# Patient Record
Sex: Male | Born: 1980 | Race: White | Hispanic: No | Smoking: Current every day smoker
Health system: Southern US, Community
[De-identification: ages and names within clinical notes are randomized; demographics above are authoritative.]

## PROBLEM LIST (undated history)

## (undated) ENCOUNTER — Emergency Department (HOSPITAL_COMMUNITY): Payer: Self-pay

## (undated) ENCOUNTER — Emergency Department (HOSPITAL_BASED_OUTPATIENT_CLINIC_OR_DEPARTMENT_OTHER): Admission: EM | Payer: Self-pay

## (undated) DIAGNOSIS — F1010 Alcohol abuse, uncomplicated: Secondary | ICD-10-CM

## (undated) DIAGNOSIS — F319 Bipolar disorder, unspecified: Secondary | ICD-10-CM

## (undated) DIAGNOSIS — F3181 Bipolar II disorder: Secondary | ICD-10-CM

## (undated) DIAGNOSIS — F4310 Post-traumatic stress disorder, unspecified: Secondary | ICD-10-CM

## (undated) HISTORY — PX: APPENDECTOMY: SHX54

## (undated) HISTORY — PX: KIDNEY STONE SURGERY: SHX686

---

## 2003-09-21 ENCOUNTER — Inpatient Hospital Stay (HOSPITAL_COMMUNITY): Admission: EM | Admit: 2003-09-21 | Discharge: 2003-09-24 | Payer: Self-pay | Admitting: Emergency Medicine

## 2011-02-01 ENCOUNTER — Emergency Department (HOSPITAL_COMMUNITY)
Admission: EM | Admit: 2011-02-01 | Discharge: 2011-02-02 | Disposition: A | Discharge status: Home / Self Care | Payer: Self-pay | Attending: Emergency Medicine | Admitting: Emergency Medicine

## 2011-02-01 DIAGNOSIS — F1910 Other psychoactive substance abuse, uncomplicated: Secondary | ICD-10-CM | POA: Insufficient documentation

## 2011-02-01 LAB — RAPID URINE DRUG SCREEN, HOSP PERFORMED
Barbiturates: NOT DETECTED
Opiates: NOT DETECTED

## 2011-02-01 LAB — CBC
HCT: 43.9 % (ref 39.0–52.0)
Hemoglobin: 15 g/dL (ref 13.0–17.0)
MCH: 31.2 pg (ref 26.0–34.0)
MCV: 91.3 fL (ref 78.0–100.0)
Platelets: 234 10*3/uL (ref 150–400)
RBC: 4.81 MIL/uL (ref 4.22–5.81)

## 2011-02-01 LAB — DIFFERENTIAL
Eosinophils Absolute: 0.3 10*3/uL (ref 0.0–0.7)
Lymphocytes Relative: 25 % (ref 12–46)
Lymphs Abs: 2 10*3/uL (ref 0.7–4.0)
Monocytes Relative: 9 % (ref 3–12)
Neutrophils Relative %: 62 % (ref 43–77)

## 2011-02-01 LAB — COMPREHENSIVE METABOLIC PANEL
Alkaline Phosphatase: 73 U/L (ref 39–117)
BUN: 13 mg/dL (ref 6–23)
CO2: 27 mEq/L (ref 19–32)
Chloride: 100 mEq/L (ref 96–112)
Creatinine, Ser: 0.97 mg/dL (ref 0.4–1.5)
GFR calc non Af Amer: >60 mL/min (ref >60)
Glucose, Bld: 96 mg/dL (ref 70–99)
Total Bilirubin: 0.3 mg/dL (ref 0.3–1.2)

## 2011-02-01 LAB — ETHANOL: Alcohol, Ethyl (B): <11 mg/dL — ABNORMAL HIGH (ref 0–10)

## 2014-05-21 DIAGNOSIS — Z8659 Personal history of other mental and behavioral disorders: Secondary | ICD-10-CM | POA: Insufficient documentation

## 2014-05-21 DIAGNOSIS — F17200 Nicotine dependence, unspecified, uncomplicated: Secondary | ICD-10-CM | POA: Insufficient documentation

## 2014-05-21 DIAGNOSIS — F1210 Cannabis abuse, uncomplicated: Secondary | ICD-10-CM | POA: Insufficient documentation

## 2014-05-21 DIAGNOSIS — Z008 Encounter for other general examination: Secondary | ICD-10-CM | POA: Insufficient documentation

## 2014-05-21 DIAGNOSIS — Z880 Allergy status to penicillin: Secondary | ICD-10-CM | POA: Insufficient documentation

## 2014-05-21 DIAGNOSIS — F1010 Alcohol abuse, uncomplicated: Secondary | ICD-10-CM | POA: Insufficient documentation

## 2014-05-22 ENCOUNTER — Emergency Department (HOSPITAL_COMMUNITY)
Admission: EM | Admit: 2014-05-22 | Discharge: 2014-05-22 | Disposition: A | Discharge status: Home / Self Care | Payer: Self-pay | Attending: Emergency Medicine | Admitting: Emergency Medicine

## 2014-05-22 ENCOUNTER — Encounter (HOSPITAL_COMMUNITY): Payer: Self-pay | Admitting: Emergency Medicine

## 2014-05-22 DIAGNOSIS — F1010 Alcohol abuse, uncomplicated: Secondary | ICD-10-CM

## 2014-05-22 DIAGNOSIS — F10920 Alcohol use, unspecified with intoxication, uncomplicated: Secondary | ICD-10-CM

## 2014-05-22 HISTORY — DX: Bipolar disorder, unspecified: F31.9

## 2014-05-22 HISTORY — DX: Bipolar II disorder: F31.81

## 2014-05-22 HISTORY — DX: Post-traumatic stress disorder, unspecified: F43.10

## 2014-05-22 LAB — COMPREHENSIVE METABOLIC PANEL
ALBUMIN: 4.2 g/dL (ref 3.5–5.2)
ALK PHOS: 84 U/L (ref 39–117)
ALT: 176 U/L — ABNORMAL HIGH (ref 0–53)
ANION GAP: 18 — AB (ref 5–15)
AST: 210 U/L — ABNORMAL HIGH (ref 0–37)
BUN: 11 mg/dL (ref 6–23)
CO2: 23 mEq/L (ref 19–32)
Calcium: 9 mg/dL (ref 8.4–10.5)
Chloride: 102 mEq/L (ref 96–112)
Creatinine, Ser: 1 mg/dL (ref 0.50–1.35)
GFR calc Af Amer: >90 mL/min (ref >90)
GFR calc non Af Amer: >90 mL/min (ref >90)
Glucose, Bld: 124 mg/dL — ABNORMAL HIGH (ref 70–99)
POTASSIUM: 4.1 meq/L (ref 3.7–5.3)
SODIUM: 143 meq/L (ref 137–147)
TOTAL PROTEIN: 7.5 g/dL (ref 6.0–8.3)
Total Bilirubin: 0.3 mg/dL (ref 0.3–1.2)

## 2014-05-22 LAB — RAPID URINE DRUG SCREEN, HOSP PERFORMED
Amphetamines: NOT DETECTED
Barbiturates: NOT DETECTED
Benzodiazepines: NOT DETECTED
COCAINE: NOT DETECTED
OPIATES: NOT DETECTED
TETRAHYDROCANNABINOL: POSITIVE — AB

## 2014-05-22 LAB — CBC
HEMATOCRIT: 41.2 % (ref 39.0–52.0)
Hemoglobin: 14.2 g/dL (ref 13.0–17.0)
MCH: 32.1 pg (ref 26.0–34.0)
MCHC: 34.5 g/dL (ref 30.0–36.0)
MCV: 93.2 fL (ref 78.0–100.0)
PLATELETS: 208 10*3/uL (ref 150–400)
RBC: 4.42 MIL/uL (ref 4.22–5.81)
RDW: 12.7 % (ref 11.5–15.5)
WBC: 7 10*3/uL (ref 4.0–10.5)

## 2014-05-22 LAB — ETHANOL: Alcohol, Ethyl (B): 292 mg/dL — ABNORMAL HIGH (ref 0–11)

## 2014-05-22 LAB — ACETAMINOPHEN LEVEL

## 2014-05-22 LAB — SALICYLATE LEVEL: Salicylate Lvl: <2 mg/dL — ABNORMAL LOW (ref 2.8–20.0)

## 2014-05-22 MED ORDER — ZOLPIDEM TARTRATE 5 MG PO TABS: 5.0000 mg | ORAL_TABLET | Freq: Every evening | ORAL | Status: DC | PRN

## 2014-05-22 MED ORDER — LORAZEPAM 1 MG PO TABS
1.0000 mg | ORAL_TABLET | Freq: Three times a day (TID) | ORAL | Status: DC | PRN
  Administered 2014-05-22 (×2): 1 mg via ORAL

## 2014-05-22 MED ORDER — THIAMINE HCL 100 MG/ML IJ SOLN: 100.0000 mg | Freq: Every day | INTRAMUSCULAR | Status: DC

## 2014-05-22 MED ORDER — IBUPROFEN 400 MG PO TABS: 600.0000 mg | ORAL_TABLET | Freq: Three times a day (TID) | ORAL | Status: DC | PRN

## 2014-05-22 MED ORDER — ALUM & MAG HYDROXIDE-SIMETH 200-200-20 MG/5ML PO SUSP: 30.0000 mL | ORAL | Status: DC | PRN

## 2014-05-22 MED ORDER — ONDANSETRON HCL 4 MG PO TABS: 4.0000 mg | ORAL_TABLET | Freq: Three times a day (TID) | ORAL | Status: DC | PRN

## 2014-05-22 MED ORDER — LORAZEPAM 1 MG PO TABS: 0.0000 mg | ORAL_TABLET | Freq: Two times a day (BID) | ORAL | Status: DC

## 2014-05-22 MED ORDER — VITAMIN B-1 100 MG PO TABS
100.0000 mg | ORAL_TABLET | Freq: Every day | ORAL | Status: DC
  Administered 2014-05-22: 100 mg via ORAL
  Filled 2014-05-22: qty 1

## 2014-05-22 MED ORDER — LORAZEPAM 1 MG PO TABS
0.0000 mg | ORAL_TABLET | Freq: Four times a day (QID) | ORAL | Status: DC
  Filled 2014-05-22 (×2): qty 1

## 2014-05-22 MED ORDER — NICOTINE 21 MG/24HR TD PT24
21.0000 mg | MEDICATED_PATCH | Freq: Once | TRANSDERMAL | Status: DC
  Administered 2014-05-22: 21 mg via TRANSDERMAL
  Filled 2014-05-22: qty 1

## 2014-05-22 MED ORDER — ACETAMINOPHEN 325 MG PO TABS: 650.0000 mg | ORAL_TABLET | ORAL | Status: DC | PRN

## 2014-05-22 NOTE — Discharge Instructions (Signed)
Do not drink alcohol - follow up with AA, and use resource guide provided for additional community resources. If mental health issues, follow up with [facility] - see referral. Also follow up with primary care doctor in coming week. Return to ER if worse, new symptoms, fevers, medical emergency, other concern.     Alcohol Intoxication Alcohol intoxication occurs when the amount of alcohol that a person has consumed impairs his or her ability to mentally and physically function. Alcohol directly impairs the normal chemical activity of the brain. Drinking large amounts of alcohol can lead to changes in mental function and behavior, and it can cause many physical effects that can be harmful.  Alcohol intoxication can range in severity from mild to very severe. Various factors can affect the level of intoxication that occurs, such as the person's age, gender, weight, frequency of alcohol consumption, and the presence of other medical conditions (such as diabetes, seizures, or heart conditions). Dangerous levels of alcohol intoxication may occur when people drink large amounts of alcohol in a short period (binge drinking). Alcohol can also be especially dangerous when combined with certain prescription medicines or "recreational" drugs. SIGNS AND SYMPTOMS Some common signs and symptoms of mild alcohol intoxication include:  Loss of coordination.  Changes in mood and behavior.  Impaired judgment.  Slurred speech. As alcohol intoxication progresses to more severe levels, other signs and symptoms will appear. These may include:  Vomiting.  Confusion and impaired memory.  Slowed breathing.  Seizures.  Loss of consciousness. DIAGNOSIS  Your health care provider will take a medical history and perform a physical exam. You will be asked about the amount and type of alcohol you have consumed. Blood tests will be done to measure the concentration of alcohol in your blood. In many places, your blood  alcohol level must be lower than 80 mg/dL (1.19%) to legally drive. However, many dangerous effects of alcohol can occur at much lower levels.  TREATMENT  People with alcohol intoxication often do not require treatment. Most of the effects of alcohol intoxication are temporary, and they go away as the alcohol naturally leaves the body. Your health care provider will monitor your condition until you are stable enough to go home. Fluids are sometimes given through an IV access tube to help prevent dehydration.  HOME CARE INSTRUCTIONS  Do not drive after drinking alcohol.  Stay hydrated. Drink enough water and fluids to keep your urine clear or pale yellow. Avoid caffeine.   Only take over-the-counter or prescription medicines as directed by your health care provider.  SEEK MEDICAL CARE IF:   You have persistent vomiting.   You do not feel better after a few days.  You have frequent alcohol intoxication. Your health care provider can help determine if you should see a substance use treatment counselor. SEEK IMMEDIATE MEDICAL CARE IF:   You become shaky or tremble when you try to stop drinking.   You shake uncontrollably (seizure).   You throw up (vomit) blood. This may be bright red or may look like black coffee grounds.   You have blood in your stool. This may be bright red or may appear as a black, tarry, bad smelling stool.   You become lightheaded or faint.  MAKE SURE YOU:   Understand these instructions.  Will watch your condition.  Will get help right away if you are not doing well or get worse. Document Released: 06/06/2005 Document Revised: 04/29/2013 Document Reviewed: 01/30/2013 [facility] Patient Information 2015 [place], [place]. This  information is not intended to replace advice given to you by your health care provider. Make sure you discuss any questions you have with your health care provider.      Alcohol Use Disorder Alcohol use disorder is a mental  disorder. It is not a one-time incident of heavy drinking. Alcohol use disorder is the excessive and uncontrollable use of alcohol over time that leads to problems with functioning in one or more areas of daily living. People with this disorder risk harming themselves and others when they drink to excess. Alcohol use disorder also can cause other mental disorders, such as mood and anxiety disorders, and serious physical problems. People with alcohol use disorder often misuse other drugs.  Alcohol use disorder is common and widespread. Some people with this disorder drink alcohol to cope with or escape from negative life events. Others drink to relieve chronic pain or symptoms of mental illness. People with a family history of alcohol use disorder are at higher risk of losing control and using alcohol to excess.  SYMPTOMS  Signs and symptoms of alcohol use disorder may include the following:   Consumption ofalcohol inlarger amounts or over a longer period of time than intended.  Multiple unsuccessful attempts to cutdown or control alcohol use.   A great deal of time spent obtaining alcohol, using alcohol, or recovering from the effects of alcohol (hangover).  A strong desire or urge to use alcohol (cravings).   Continued use of alcohol despite problems at work, school, or home because of alcohol use.   Continued use of alcohol despite problems in relationships because of alcohol use.  Continued use of alcohol in situations when it is physically hazardous, such as driving a car.  Continued use of alcohol despite awareness of a physical or psychological problem that is likely related to alcohol use. Physical problems related to alcohol use can involve the brain, heart, liver, stomach, and intestines. Psychological problems related to alcohol use include intoxication, depression, anxiety, psychosis, delirium, and dementia.   The need for increased amounts of alcohol to achieve the same  desired effect, or a decreased effect from the consumption of the same amount of alcohol (tolerance).  Withdrawal symptoms upon reducing or stopping alcohol use, or alcohol use to reduce or avoid withdrawal symptoms. Withdrawal symptoms include:  Racing heart.  Hand tremor.  Difficulty sleeping.  Nausea.  Vomiting.  Hallucinations.  Restlessness.  Seizures. DIAGNOSIS Alcohol use disorder is diagnosed through an assessment by your health care provider. Your health care provider may start by asking three or four questions to screen for excessive or problematic alcohol use. To confirm a diagnosis of alcohol use disorder, at least two symptoms must be present within a 34-month period. The severity of alcohol use disorder depends on the number of symptoms:  Mild--two or three.  Moderate--four or five.  Severe--six or more. Your health care provider may perform a physical exam or use results from lab tests to see if you have physical problems resulting from alcohol use. Your health care provider may refer you to a mental health professional for evaluation. TREATMENT  Some people with alcohol use disorder are able to reduce their alcohol use to low-risk levels. Some people with alcohol use disorder need to quit drinking alcohol. When necessary, mental health professionals with specialized training in substance use treatment can help. Your health care provider can help you decide how severe your alcohol use disorder is and what type of treatment you need. The following forms of  treatment are available:   Detoxification. Detoxification involves the use of prescription medicines to prevent alcohol withdrawal symptoms in the first week after quitting. This is important for people with a history of symptoms of withdrawal and for heavy drinkers who are likely to have withdrawal symptoms. Alcohol withdrawal can be dangerous and, in severe cases, cause death. Detoxification is usually provided in a  hospital or in-patient substance use treatment facility.  Counseling or talk therapy. Talk therapy is provided by substance use treatment counselors. It addresses the reasons people use alcohol and ways to keep them from drinking again. The goals of talk therapy are to help people with alcohol use disorder find healthy activities and ways to cope with life stress, to identify and avoid triggers for alcohol use, and to handle cravings, which can cause relapse.  Medicines.Different medicines can help treat alcohol use disorder through the following actions:  Decrease alcohol cravings.  Decrease the positive reward response felt from alcohol use.  Produce an uncomfortable physical reaction when alcohol is used (aversion therapy).  Support groups. Support groups are run by people who have quit drinking. They provide emotional support, advice, and guidance. These forms of treatment are often combined. Some people with alcohol use disorder benefit from intensive combination treatment provided by specialized substance use treatment centers. Both inpatient and outpatient treatment programs are available. Document Released: 10/04/2004 Document Revised: 01/11/2014 Document Reviewed: 12/04/2012 [facility] Patient Information 2015 [place], [place]. This information is not intended to replace advice given to you by your health care provider. Make sure you discuss any questions you have with your health care provider.      Emergency Department Resource Guide 1) Find a Doctor and Pay Out of Pocket Although you won't have to find out who is covered by your insurance plan, it is a good idea to ask around and get recommendations. You will then need to call the office and see if the doctor you have chosen will accept you as a new patient and what types of options they offer for patients who are self-pay. Some doctors offer discounts or will set up payment plans for their patients who do not have insurance, but you  will need to ask so you aren't surprised when you get to your appointment.  2) Contact Your Local Health Department Not all health departments have doctors that can see patients for sick visits, but many do, so it is worth a call to see if yours does. If you don't know where your local health department is, you can check in your phone book. The CDC also has a tool to help you locate your state's health department, and many state websites also have listings of all of their local health departments.  3) Find a Walk-in Clinic If your illness is not likely to be very severe or complicated, you may want to try a walk in clinic. These are popping up all over the country in pharmacies, drugstores, and shopping centers. They're usually staffed by nurse practitioners or physician assistants that have been trained to treat common illnesses and complaints. They're usually fairly quick and inexpensive. However, if you have serious medical issues or chronic medical problems, these are probably not your best option.  No Primary Care Doctor: - Call Health Connect at  [phone] - they can help you locate a primary care doctor that  accepts your insurance, provides certain services, etc. - Physician Referral Service- [phone]  Chronic Pain Problems: Organization  Address  Phone   Notes  [facility]  [phone] Patients need to be referred by their primary care doctor.   Medication Assistance: Organization         Address  Phone   Notes  [facility] Medication [facility] [address] [place], [place] [address] [phone] --Must be a resident of [facility] -- Must have NO insurance coverage whatsoever (no Medicaid/ Medicare, etc.) -- The pt. MUST have a primary care doctor that directs their care regularly and follows them in the community   MedAssist  [phone]   [employer]  [phone]    Agencies that provide inexpensive medical  care: Organization         Address  Phone   Notes  [place]  [phone]   [facility]    [phone]   [facility] [place], [place] [address] [phone]   [place] [address] [place] [phone]   Planned Parenthood    [phone]   [facility]    [phone]   [place] and [place], [place] Phone:  [phone], Fax:  [phone] Hours of Operation:  9 am - 6 pm, M-F.  Also accepts Medicaid/Medicare and self-pay.  [facility] for [place], [address], [place] Phone: [phone], Fax: [phone]. Hours of Operation:  8:30 am - 5:30 pm, M-F.  Also accepts Medicaid and self-pay.  [facility] [place] [address], [place] Phone: [phone]   [place], [place], [place] [phone], Ext. [phone] Mondays & Thursdays: 7-9 AM.  First 15 patients are seen on a first come, first serve basis.    [place] Providers:  Organization         Address  Phone   Notes  [facility] [address], [place] [phone] Also accepts self-pay patients.  [facility] [address], [place]  [phone]   [place], [address], [place] [phone]   [facility] [address], [place] [phone]   Lucianne Lei [address], [place]   [phone] Only accepts [place] Access [place] patients after they have their name applied to their card.   Self-Pay (no insurance) in [facility]:  Organization         Address  Phone   Notes  Sickle Cell Patients, [facility] Internal Medicine [place] [phone]   [facility] [place] [phone]   [facility] [place]  [place], [place], [place] [phone]   [facility]/Dr. Osei-Bonsu  [address] [place] or [address], [place] [phone] Phone number for both [place] and [place] locations is the same.  Urgent Medical and [facility] [address] [place] [phone]   [facility] [address], [place] or [address] [phone]   [facility] [address], [place] [phone], phone; [phone], fax Sees patients 1st and 3rd Saturday of every month.  Must not qualify  for public or private insurance (i.e. Medicaid, Medicare, [place] Health Choice, Veterans' Benefits)  Household income should be no more than 200% of the poverty level The clinic cannot treat you if you are pregnant or think you are pregnant  Sexually transmitted diseases are not treated at the clinic.    Dental Care: Organization         Address  Phone  Notes  [facility] Department of [facility] [place] [phone] Accepts children up to age 41 who are enrolled in [place] or [place]; pregnant women with a Medicaid card; and children who have applied for Medicaid or [place] Health Choice, but were declined, whose parents can pay a reduced fee at time of service.  [facility] Department of [facility]  [address], [place] [phone] Accepts children up to age 15 who are enrolled in [place] or [place]; pregnant women with a Medicaid card; and children who have applied for Medicaid or [place] Health Choice, but were declined, whose parents can pay a reduced fee at time of service.  [place] Adult [facility] Access PROGRAM  [place] [phone] Patients are seen by appointment only. Walk-ins are not accepted. [place] will see patients 6 years of age and older. Monday - Tuesday (8am-5pm) Most Wednesdays (8:30-5pm) $30 per visit, cash only  [facility] Access [facility]  [address], [facility] [phone] Patients are seen by appointment only. Walk-ins are not accepted. [place] will see patients 25 years of age and older. One Wednesday Evening (Monthly: Volunteer Based).  $30 per visit, cash only  [place]  [phone] for adults; Children under age 27, call Graduate Pediatric Dentistry at [phone]. Children aged 54-14, please call [phone] to request a pediatric application.  Dental services are provided in all areas of dental care including fillings, crowns and bridges, complete and partial dentures, implants, gum treatment, root canals, and extractions. Preventive care is also provided. Treatment is provided to both adults and children. Patients are selected via a lottery and there is often a waiting list.   [facility] [address] [place]  [phone] [email or web address]   [facility] [address] [place], [place] [phone], Ext. [phone] Second and Fourth Thursday of each month, opens at 6:30 AM; Clinic ends at 9 AM.  Patients are seen on a first-come first-served basis, and a limited number are seen during each clinic.   [facility]  [address] Hillard Danker [place], [place] [phone]   Eligibility Requirements You must have lived in [place], [place], or [place] counties for at least the last three months.   You cannot be eligible for state or federal sponsored [employer], including [employer], [place], or [employer].   You generally cannot be eligible for healthcare insurance through your employer.    How to apply: Eligibility screenings are held every Tuesday and Wednesday afternoon from 1:00 pm until 4:00 pm. You do not need an appointment for the interview!   [facility] [address], [place], [place]   [place]  [place] Department  [place]  [phone]    Behavioral Health Resources in the Community: Intensive Outpatient Programs Organization         Address  Phone  Notes  [facility] [address] [place], [place] [phone]   [facility] Outpatient [address], [place], [place]   ADS: [facility] [address] [place], [place]   [place] [address]  [place], [place] or [phone]   Substance Abuse Resources Organization         Address  Phone  Notes  Alcohol [facility] Drug Services  [phone]   [place]  [phone]   [facility] [place]   Chinita Pester  [phone]   Residential & Outpatient Substance Abuse Program  [phone]   Psychological Services Organization         Address  Phone  Notes  [facility] [place]  [place]  [phone]   [place] [address], [place] or [phone]    Mobile Crisis Teams Organization         Address  Phone  Notes  [facility], Mobile Crisis Care Unit  [phone]   [facility]  [address] [place], [place]   [place] [address], [place] [phone]    Self-Help/Support Groups Organization         Address  Phone             Notes  [place]. of Fate - variety of support groups  [place] Call for more information  Narcotics Anonymous (NA), [facility] [address], [employer] [place]  2 meetings at this location   Special educational needs teacher         Address  Phone  Notes  ASAP Residential Treatment [place],    [place]  [phone]   [facility]  [address] [place] [address], [place], [place]   [place], [place] [phone] Admissions: 8am-3pm M-F  Incentives Substance [place] [address]    [place], [place] [phone]   [facility] [address] [place], [place], [place]   [facility] [address],  [place], [place]   [facility] - Intensive Outpatient [address] Kristeen Mans [address], [place], [place]   [facility] ([place].,  [place], [place] [phone] or [phone]   [facility] ([facility]S) [address] [place], [place] Accepts Medicaid  [facility] [place] [address]  [place] [phone] Substance Abuse/Addiction Treatment   [facility] Organization         Address  Phone  Notes  [facility]  [phone]   Domenic Schwab, PhD [address] Arlis Porta [place], [place]   [phone] or [phone]   [place]   [address] [place], [place] [phone]   [facility] [address] [place], [place] [phone] Insurance/Medicaid/sponsorship through [facility] and Families [address] [place]                                    [place], [place] [phone] [place] [address], [place] [phone]    Dr. Adele Schilder  [phone]   [facility] [place]  [facility]. 1) [address], [place] 2) [place] 3)  [place] [address], [place] [phone]  [phone]   [place] [phone] or [phone] (After Hours)
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
## 2014-05-22 NOTE — ED Provider Notes (Signed)
Medical screening examination/treatment/procedure(s) were performed by non-physician practitioner and as supervising physician I was immediately available for consultation/collaboration.   EKG Interpretation None        Tomasita Crumble, MD 05/22/14 1721

## 2014-05-22 NOTE — ED Notes (Signed)
Patient arrives with complaint of poly-substance abuse with desire for detox. States that he has been abusing ETOH for many years. Explains that he was clean for a while and then many family member died and he re-lapsed. States reason for presenting tonight was do to seeing a friend die from cirrhosis, he went to church, found a new family, and has decided "to put it in the lords hands". Explains that his drug screen will show numerous substances because he wanted to make sure it was positive. Last ETOH reported as "right after registering, I finished my last beer".

## 2014-05-22 NOTE — ED Notes (Signed)
Pt comes voluntary asking for help for detox from ETOH, pt states that he drinks a lot of Vodka and beer daily and use Mariguana every day, pt states he is trying to get out of this but he needs help.

## 2014-05-22 NOTE — ED Notes (Signed)
TTS in process 

## 2014-05-22 NOTE — ED Provider Notes (Signed)
Pt with hx etoh abuse.  States has been through many inpatient programs in past and chosen not to maintain sobriety.   [facility] staff has been unable to place into inpatient program.  Given above, and in setting that pt is calm/alert, no tremor or shakes, no diaphoresis, no hx dts, hr 90, rr 16 - pt appears stable for referral to outpatient rehab/detox.  Discussed plan w pt.  Pt is eating and drinking. Normal appetite. Normal mood/affect. No SI.    Suzi Roots, MD 05/22/14 1054

## 2014-05-22 NOTE — BH Assessment (Signed)
Binnie Rail, [facility] at [facility], confirms adult unit is currently at capacity. Contacted the following facilities for placement:   BED AVAILABLE, FAXED CLINICAL INFORMATION:  [facility], per Misty Stanley   AT CAPACITY:  [facility], per [facility], per Alfredia Client, per [facility], per [facility], per [facility], per [facility], per [facility], per [facility], per [facility], per [facility], per [facility], per [facility], per [facility], per [facility], per Thayer Ohm.   MULTIPLE ATTEMPTS TO CONTACT WITH NO RESPONSE:  [facility]  [facility]  [facility]   PT DECLINED:  [facility], per Erskine Squibb no detox services  [facility], per [place] no detox services  [employer], per [facility] [employer] no detox services    Harlin Rain Patsy [place], [facility], [facility]  Triage Specialist  [phone]

## 2014-05-22 NOTE — ED Notes (Signed)
telephysch set up at the bedside.

## 2014-05-22 NOTE — Progress Notes (Addendum)
Role of Case Manager explained to patient.Patient verbalizes understanding.Patient requesting inpatient Detox admission.[facility] staff are not able to place Patient in an inpatient program.Patient is calm and appropriate and CM explained she can assist with some resources for patient.Patient receptive to this Patient provided with resources for [employer] for services( contact numbers provided) Patient also provided with a 211 Resource card,and support group information for the Mental health association in [place].Patient does not have a PCP or health Insurance.This CM  Provided patient with resource contact sheet for the [facility] health / Wellness center( patient also gave this CM his verbal consent for this CM to e-mail the [facility] and assist with his PCP appointment.Patient also provided with shelter list.CM contacted the [facility] who had no available beds.Patient  [facility].This CM called and was advised bed availability not released till later tonight( patient updated of this and reports he will Call.Patient reports he has no family or friends in the area but is in contact with his church pastor who is providing support.Food pantry resouces given to Patient.Patient thanked CM for resources and education.

## 2014-05-22 NOTE — BH Assessment (Signed)
Assessment completed. Consulted Dr. Adela Glimpse who recommended inpatient treatment. [facility] at capacity. TTS will contact other facilities for placement. Sharilyn Sites, PA-C has been notified of the recommendation.

## 2014-05-22 NOTE — ED Notes (Signed)
Pt wondering in hallway of Pod A requesting to smoke a cigarette. Pt states "I can here to detox off alcohol, not to quit smoking." Pt agitated about not being able to smoke. Informed patient MD can order a nicotine patch

## 2014-05-22 NOTE — ED Provider Notes (Signed)
CSN: [id number]     Arrival date & time 05/21/14  2333 History   First MD Initiated Contact with Patient 05/22/14 0053     Chief Complaint  Patient presents with  . Alcohol Problem  . Medical Clearance     (Consider location/radiation/quality/duration/timing/severity/associated sxs/prior Treatment) The history is provided by the patient and medical records.   This is a 33 y.o. M with PMH significant for PTSD, bipolar disorder, manic depression, presenting to the ED requesting alcohol detox. Patient states he was previously sober for 3 years, but relapsed after the death of his father. He states for the past 3 years he has been in and out of AA seeking treatment for his alcohol abuse. He states he drinks on a daily basis, at least one fifth of liquor and a 12 pack of beer. He does endorse occasional illicit drug use, mostly marijuana. Last use was yesterday.  Patient denies any suicidal or homicidal ideation. He denies any auditory or visual hallucinations.  Past Medical History  Diagnosis Date  . PTSD (post-traumatic stress disorder)   . Bipolar 2 disorder   . Manic depression    Past Surgical History  Procedure Laterality Date  . Appendectomy     No family history on file. History  Substance Use Topics  . Smoking status: Current Every Day Smoker -- 1.50 packs/day for 7 years    Types: Cigarettes  . Smokeless tobacco: Not on file  . Alcohol Use: Yes    Review of Systems  Psychiatric/Behavioral:       Detox  All other systems reviewed and are negative.     Allergies  Penicillins  Home Medications   Prior to Admission medications   Not on File   BP 131/78  Pulse 107  Temp(Src) 98.1 F (36.7 C) (Oral)  Resp 18  Ht  (1.753 m)  Wt 165 lb (74.844 kg)  BMI 24.36 kg/m2  SpO2 95%  Physical Exam  Nursing note and vitals reviewed. Constitutional: He is oriented to person, place, and time. He appears well-developed and well-nourished.  HENT:  Head:  Normocephalic and atraumatic.  Mouth/Throat: Oropharynx is clear and moist.  Eyes: Conjunctivae and EOM are normal. Pupils are equal, round, and reactive to light.  Neck: Normal range of motion.  Cardiovascular: Normal rate, regular rhythm and normal heart sounds.   Pulmonary/Chest: Effort normal and breath sounds normal.  Abdominal: Soft. Bowel sounds are normal.  Musculoskeletal: Normal range of motion.  Neurological: He is alert and oriented to person, place, and time.  Skin: Skin is warm and dry.  Psychiatric: He has a normal mood and affect. Thought content normal. He is not actively hallucinating. He expresses no homicidal and no suicidal ideation. He expresses no suicidal plans and no homicidal plans.    ED Course  Procedures (including critical care time) Labs Review Labs Reviewed  COMPREHENSIVE METABOLIC PANEL - Abnormal; Notable for the following:    Glucose, Bld 124 (*)    AST 210 (*)    ALT 176 (*)    Anion gap 18 (*)    All other components within normal limits  ETHANOL - Abnormal; Notable for the following:    Alcohol, Ethyl (B) 292 (*)    All other components within normal limits  SALICYLATE LEVEL - Abnormal; Notable for the following:    Salicylate Lvl <2.0 (*)    All other components within normal limits  ACETAMINOPHEN LEVEL  CBC  URINE RAPID DRUG SCREEN (HOSP PERFORMED)  Imaging Review No results found.   EKG Interpretation None      MDM   Final diagnoses:  Alcohol abuse   33 year old male in the ED voluntarily requesting detox from alcohol. He has been having issues with alcohol abuse since the death of father 3 years ago.  On exam, patient does appear intoxicated and somewhat anxious but is easily calmed. Lab work was obtained-- transaminitis with 2:1 AST/ALT ratio consistent with alcohol abuse.  Ethanol 292.  Patient medically cleared and placed on CIWA protocol, temp holding orders in place.  TTS evaluation pending.  Patient initially upset that  he could not go outside to smoke, informed of rules requiring him to remain in ED for assessment.  Patient given nicotine patch and agreed to stay.  2:56 AM TTS has evaluated patient, recommends IP treatment.  Patient currently awaiting placement.  VS remain stable.  Garlon Hatchet, PA-C 05/22/14 [phone]

## 2014-07-15 ENCOUNTER — Encounter (HOSPITAL_BASED_OUTPATIENT_CLINIC_OR_DEPARTMENT_OTHER): Payer: Self-pay | Admitting: Emergency Medicine

## 2014-07-15 ENCOUNTER — Emergency Department (HOSPITAL_BASED_OUTPATIENT_CLINIC_OR_DEPARTMENT_OTHER)
Admission: EM | Admit: 2014-07-15 | Discharge: 2014-07-15 | Disposition: A | Discharge status: Home / Self Care | Payer: Self-pay | Attending: Emergency Medicine | Admitting: Emergency Medicine

## 2014-07-15 DIAGNOSIS — F4310 Post-traumatic stress disorder, unspecified: Secondary | ICD-10-CM | POA: Insufficient documentation

## 2014-07-15 DIAGNOSIS — F3181 Bipolar II disorder: Secondary | ICD-10-CM | POA: Insufficient documentation

## 2014-07-15 DIAGNOSIS — K088 Other specified disorders of teeth and supporting structures: Secondary | ICD-10-CM | POA: Insufficient documentation

## 2014-07-15 DIAGNOSIS — Z880 Allergy status to penicillin: Secondary | ICD-10-CM | POA: Insufficient documentation

## 2014-07-15 DIAGNOSIS — Z720 Tobacco use: Secondary | ICD-10-CM | POA: Insufficient documentation

## 2014-07-15 DIAGNOSIS — K0889 Other specified disorders of teeth and supporting structures: Secondary | ICD-10-CM

## 2014-07-15 DIAGNOSIS — Z79899 Other long term (current) drug therapy: Secondary | ICD-10-CM | POA: Insufficient documentation

## 2014-07-15 DIAGNOSIS — F319 Bipolar disorder, unspecified: Secondary | ICD-10-CM | POA: Insufficient documentation

## 2014-07-15 MED ORDER — CLINDAMYCIN HCL 300 MG PO CAPS: 300.0000 mg | ORAL_CAPSULE | Freq: Four times a day (QID) | ORAL | Status: DC
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
## 2014-07-15 NOTE — Discharge Instructions (Signed)
Dental Pain °A tooth ache may be caused by cavities (tooth decay). Cavities expose the nerve of the tooth to air and hot or cold temperatures. It may come from an infection or abscess (also called a boil or furuncle) around your tooth. It is also often caused by dental caries (tooth decay). This causes the pain you are having. °DIAGNOSIS  °Your caregiver can diagnose this problem by exam. °TREATMENT  °· If caused by an infection, it may be treated with medications which kill germs (antibiotics) and pain medications as prescribed by your caregiver. Take medications as directed. °· Only take over-the-counter or prescription medicines for pain, discomfort, or fever as directed by your caregiver. °· Whether the tooth ache today is caused by infection or dental disease, you should see your dentist as soon as possible for further care. °SEEK MEDICAL CARE IF: °The exam and treatment you received today has been provided on an emergency basis only. This is not a substitute for complete medical or dental care. If your problem worsens or new problems (symptoms) appear, and you are unable to meet with your dentist, call or return to this location. °SEEK IMMEDIATE MEDICAL CARE IF:  °· You have a fever. °· You develop redness and swelling of your face, jaw, or neck. °· You are unable to open your mouth. °· You have severe pain uncontrolled by pain medicine. °MAKE SURE YOU:  °· Understand these instructions. °· Will watch your condition. °· Will get help right away if you are not doing well or get worse. °Document Released: 08/27/2005 Document Revised: 11/19/2011 Document Reviewed: 04/14/2008 °ExitCare® Patient Information ©2015 [employer]. This information is not intended to replace advice given to you by your health care provider. Make sure you discuss any questions you have with your health care provider. ° °Emergency Department Resource Guide °1) Find a Doctor and Pay Out of Pocket °Although you won't have to find out who  is covered by your insurance plan, it is a good idea to ask around and get recommendations. You will then need to call the office and see if the doctor you have chosen will accept you as a new patient and what types of options they offer for patients who are self-pay. Some doctors offer discounts or will set up payment plans for their patients who do not have insurance, but you will need to ask so you aren't surprised when you get to your appointment. ° °2) Contact Your Local Health Department °Not all health departments have doctors that can see patients for sick visits, but many do, so it is worth a call to see if yours does. If you don't know where your local health department is, you can check in your phone book. The CDC also has a tool to help you locate your state's health department, and many state websites also have listings of all of their local health departments. ° °3) Find a Walk-in Clinic °If your illness is not likely to be very severe or complicated, you may want to try a walk in clinic. These are popping up all over the country in pharmacies, drugstores, and shopping centers. They're usually staffed by nurse practitioners or physician assistants that have been trained to treat common illnesses and complaints. They're usually fairly quick and inexpensive. However, if you have serious medical issues or chronic medical problems, these are probably not your best option. ° °No Primary Care Doctor: °- Call [facility] at  832-8000 - they can help you locate a primary   care doctor that  accepts your insurance, provides certain services, etc. °- Physician Referral Service- 1-800-533-3463 ° °Chronic Pain Problems: °Organization         Address  Phone   Notes  °[facility]  (336) 297-2271 Patients need to be referred by their primary care doctor.  ° °Medication Assistance: °Organization         Address  Phone   Notes  °[place] [facility] [address]  [address] °[place], [place] [address] (336) 641-8030 --Must be a resident of [place] °-- Must have NO insurance coverage whatsoever (no Medicaid/ Medicare, etc.) °-- The pt. MUST have a primary care doctor that directs their care regularly and follows them in the community °  °[facility]  (866) 331-1348   °[employer]  (888) 892-1162   ° °Agencies that provide inexpensive medical care: °Organization         Address  Phone   Notes  °[facility]  (336) 832-8035   °[facility]    (336) 832-7272   °[facility] [address] °[place], [place] [address] (336) 832-4777   °[facility] [address], °[place] (336) 271-4999   °[facility]    (336) 373-0678   °[facility]    (336) 272-1050   °[facility] ° [address], [place] Phone:  (336) 832-4444, Fax:  (336) 832-4440 Hours of Operation:  9 am - 6 pm, M-F.  Also accepts Medicaid/Medicare and self-pay.  °[facility] ° [address], [place] Phone: (336) 832-3150, Fax: (336) 832-3151. Hours of Operation:  8:30 am - 5:30 pm, M-F.  Also accepts Medicaid and self-pay.  °[facility] [place] [address], [place] Phone: (336) 878-6027   °[facility] [address], [place], [place] (336)723-1848, Ext. 123 Mondays & Thursdays: 7-9 AM.  First 15 patients are seen on a first come, first serve basis. °  ° °Medicaid-accepting [place] Providers: ° °Organization         Address  Phone   Notes  °[facility] [address], [place] (336) 641-2100 Also accepts self-pay patients.  °[facility] [address], [place] ° (336) 856-9996   °[facility] [address], [place] (336) 288-8857   °[facility] [address] [place] [address], [place] (336) 299-7000   °Veita Bland [address], [place]  ° (336) 373-1557 Only accepts [place] Access Medicaid patients after they have their name applied to their card.  ° °Self-Pay (no insurance) in [place]: ° °Organization         Address  Phone   Notes  °Sickle Cell Patients, [facility] [address], [place] (336) 832-1970   °[facility] [address], [place] (336) 832-4400   °[facility] [place] ° [address], [place] (336) 992-4800   °[facility]/Dr. Osei-Bonsu ° [address] [place] [address], [place] or [address], [place] (336) 841-8500 Phone number for both [place] and [place] locations is the same.  °[facility] [address], [place] (336) 299-0000   °[facility] [address] [place] [address], [place] or [address] (336) 852-7530 °(336) 878-2260   °  [facility] [address], [place] (336) 350-1642, phone; (336) 294-5005, fax Sees patients 1st and 3rd Saturday of every month.  Must not qualify for public or private insurance (i.e. Medicaid, Medicare, [place] Health Choice, Veterans' Benefits) • Household income should be no more than 200% of the poverty level •The clinic cannot treat you if you are pregnant or think you are pregnant • Sexually transmitted diseases are not treated at the clinic.  ° ° °Dental Care: °Organization         Address  Phone  Notes  °[facility] [address], [place] (336) 641-6152 Accepts children up to age 21 who are enrolled in Medicaid or [place] Health Choice; pregnant women with a Medicaid card; and children who have applied for Medicaid or [place] Health Choice, but were declined, whose parents can pay a reduced fee at time of service.  °[facility] [place]  [address], [place] (336) 641-7733 Accepts children up to age 21 who are  enrolled in Medicaid or [place] Health Choice; pregnant women with a Medicaid card; and children who have applied for Medicaid or [place] Health Choice, but were declined, whose parents can pay a reduced fee at time of service.  °[facility] ° [address], [place] (336) 641-4533 Patients are seen by appointment only. Walk-ins are not accepted. [facility] will see patients 18 years of age and older. °Monday - Tuesday (8am-5pm) °Most Wednesdays (8:30-5pm) °$30 per visit, cash only  °[facility] ° [address], [place] (336) 641-4533 Patients are seen by appointment only. Walk-ins are not accepted. [facility] will see patients 18 years of age and older. °One Wednesday Evening (Monthly: Volunteer Based).  $30 per visit, cash only  °[facility]  (919) 537-3737 for adults; Children under age 4, call Graduate Pediatric Dentistry at (919) [phone]. Children aged 4-14, please call (919) [phone] to request a pediatric application. ° Dental services are provided in all areas of dental care including fillings, crowns and bridges, complete and partial dentures, implants, gum treatment, root canals, and extractions. Preventive care is also provided. Treatment is provided to both adults and children. °Patients are selected via a lottery and there is often a waiting list. °  °[facility] [address], °[place] ° (336) 763-8833 [email or web address] °  °[facility] [address], [place], [place] (336)723-1848, Ext. 123 Second and Fourth Thursday of each month, opens at 6:30 AM; Clinic ends at 9 AM.  Patients are seen on a first-come first-served basis, and a limited number are seen during each clinic.  ° °[facility] ° [address], [place], [place] (336) 723-7904   Eligibility Requirements °You must have lived in [place], [place], or [place] counties for at least the last three months. °  You  cannot be eligible for state or federal sponsored healthcare insurance, including Veterans Administration, Medicaid, or Medicare. °  You generally cannot be eligible for healthcare insurance through your employer.  °  How to apply: °Eligibility screenings are held every Tuesday and Wednesday afternoon from 1:00 pm until 4:00 pm. You do not need an appointment for the interview!  °[facility] [address], [place], [place] 336-631-2330   °[facility]  336-342-8273   °[facility]  336-703-3100   °[facility]  336-570-6415   ° °Behavioral Health Resources in the Community: °Intensive Outpatient Programs °Organization         Address  Phone  Notes  °[facility] [address], [place], [place] 336-878-6098   °[facility] [address], [place], [place] 336-832-9800   °[facility] [address], [place], [place] ° 336-882-2125   °[facility] [address],  °[place], [place] 1-800-853-5163 or 336-641-4981   °Substance Abuse Resources °Organization         Address  Phone  Notes  °[facility]  336-882-2125   °[facility]  336-784-9470   °[facility]  336-285-9073   °[facility]  336-845-3988   °Residential & Outpatient Substance Abuse Program  1-800-659-3381   °Psychological Services °Organization         Address  Phone  Notes  ° Health  336- 832-9600   °[facility]  336- 378-7881   °[facility] [address], [place] 1-800-853-5163 or 336-641-4981   ° °Mobile Crisis Teams °Organization         Address  Phone  Notes  °[facility], Mobile Crisis Care Unit  1-877-626-1772   °Assertive °[facility] ° [address] [place], [place] 336-834-9664   °Sharon DeEsch [address] °[place] 336-554-5454   ° °Self-Help/Support  Groups °Organization         Address  Phone             Notes  °[facility] [place] - variety of support groups  336- 373-1402 Call for more information  °Narcotics Anonymous (NA), [facility] [address], °[place]  2 meetings at this location  ° °Residential Treatment Programs °Organization         Address  Phone  Notes  °[facility] [address],    °[place]  1-866-801-8205   °[facility] ° [address], [place], [place] 704-293-8524   °[facility] [address], [place] 336-845-3988 Admissions: 8am-3pm M-F  °[facility] [address]    °[place], [place] 336-841-1104   °[facility] [address], [place], [place] 336-379-7146   °[facility] [address]  °[place], [place] 336-285-9073   °[facility] - Intensive Outpatient [address], [place], [place] 336-852-3033   °[facility] [address]  °[place], [place] 1-877-615-2722 or 336-784-9470   °[facility] [address] [place], [place] 336-227-7417 Accepts Medicaid  °[facility] [address]  °[place] 1-800-659-3381 Substance Abuse/Addiction Treatment  ° °[place] Behavioral Health Resources °Organization         Address  Phone  Notes  °[facility]  (888) 581-9988   °Julie Brannon, PhD [address] [place], [place]   (336) 349-5553 or (336) 951-0000   °[facility]   [address] °[place], [place] (336) 349-4454   °[facility] [address], [place], [place] (336) 342-8316 Insurance/Medicaid/sponsorship through [facility]  °[facility] [address]                                    [place], [place] (336) 342-8316 Therapy/tele-psych/case  °[facility]   [address]  ° [place], [place] (336) 349-2233    °Dr. Arfeen  (336) 349-4544   °[facility] [place]  [place]  [employer] [facility] [place] [facility] 1) [address], [place] °2) [address], [place] °3)  [address], [place] (336) 349-3220 °(336) 342-7768 ° °(336) 342-8140   °[place] [facility] Abuse [facility] (336) 342-1394 or (336) 342-3537 (After Hours)    ° ° ° °

## 2014-07-15 NOTE — ED Notes (Signed)
Pt having left upper dental pain approximately one month ago.  Pt from [facility], so no narcotics.

## 2014-07-19 NOTE — ED Notes (Signed)
Patient called to state that he needs a referral for the Dentist who was on call on Thur 07/15/14.  [facility]. Norwood LevoBenitz who was on call 07/15/14 and Dr. Lucky CowboyKnox who is on call 07/19/14

## 2016-08-27 ENCOUNTER — Emergency Department (HOSPITAL_COMMUNITY)
Admission: EM | Admit: 2016-08-27 | Discharge: 2016-08-27 | Disposition: A | Discharge status: Home / Self Care | Payer: Self-pay | Attending: Emergency Medicine | Admitting: Emergency Medicine

## 2016-08-27 ENCOUNTER — Encounter (HOSPITAL_COMMUNITY): Payer: Self-pay | Admitting: Emergency Medicine

## 2016-08-27 DIAGNOSIS — F1020 Alcohol dependence, uncomplicated: Secondary | ICD-10-CM | POA: Insufficient documentation

## 2016-08-27 DIAGNOSIS — Z79899 Other long term (current) drug therapy: Secondary | ICD-10-CM | POA: Insufficient documentation

## 2016-08-27 DIAGNOSIS — F17210 Nicotine dependence, cigarettes, uncomplicated: Secondary | ICD-10-CM | POA: Insufficient documentation

## 2016-08-27 HISTORY — DX: Alcohol abuse, uncomplicated: F10.10

## 2016-08-27 LAB — COMPREHENSIVE METABOLIC PANEL
ALT: 56 U/L (ref 17–63)
AST: 116 U/L — AB (ref 15–41)
Albumin: 3.7 g/dL (ref 3.5–5.0)
Alkaline Phosphatase: 82 U/L (ref 38–126)
Anion gap: 11 (ref 5–15)
BUN: 12 mg/dL (ref 6–20)
CHLORIDE: 102 mmol/L (ref 101–111)
CO2: 24 mmol/L (ref 22–32)
CREATININE: 0.82 mg/dL (ref 0.61–1.24)
Calcium: 8.4 mg/dL — ABNORMAL LOW (ref 8.9–10.3)
GFR calc Af Amer: >60 mL/min (ref >60)
GFR calc non Af Amer: >60 mL/min (ref >60)
Glucose, Bld: 106 mg/dL — ABNORMAL HIGH (ref 65–99)
Potassium: 3.9 mmol/L (ref 3.5–5.1)
SODIUM: 137 mmol/L (ref 135–145)
Total Bilirubin: 0.6 mg/dL (ref 0.3–1.2)
Total Protein: 6.3 g/dL — ABNORMAL LOW (ref 6.5–8.1)

## 2016-08-27 LAB — CBC WITH DIFFERENTIAL/PLATELET
BASOS ABS: 0.1 10*3/uL (ref 0.0–0.1)
Basophils Relative: 1 %
EOS ABS: 0.1 10*3/uL (ref 0.0–0.7)
EOS PCT: 1 %
HCT: 35.7 % — ABNORMAL LOW (ref 39.0–52.0)
Hemoglobin: 12.2 g/dL — ABNORMAL LOW (ref 13.0–17.0)
Lymphocytes Relative: 12 %
Lymphs Abs: 0.9 10*3/uL (ref 0.7–4.0)
MCH: 32.9 pg (ref 26.0–34.0)
MCHC: 34.2 g/dL (ref 30.0–36.0)
MCV: 96.2 fL (ref 78.0–100.0)
Monocytes Absolute: 0.9 10*3/uL (ref 0.1–1.0)
Monocytes Relative: 12 %
Neutro Abs: 5.8 10*3/uL (ref 1.7–7.7)
Neutrophils Relative %: 75 %
PLATELETS: 99 10*3/uL — AB (ref 150–400)
RBC: 3.71 MIL/uL — AB (ref 4.22–5.81)
RDW: 14.2 % (ref 11.5–15.5)
WBC: 7.8 10*3/uL (ref 4.0–10.5)

## 2016-08-27 LAB — ETHANOL: Alcohol, Ethyl (B): 59 mg/dL — ABNORMAL HIGH (ref <5)

## 2016-08-27 MED ORDER — LORAZEPAM 2 MG/ML IJ SOLN
1.0000 mg | Freq: Once | INTRAMUSCULAR | Status: AC
  Administered 2016-08-27: 1 mg via INTRAVENOUS
  Filled 2016-08-27: qty 1

## 2016-08-27 MED ORDER — SODIUM CHLORIDE 0.9 % IV BOLUS (SEPSIS)
1000.0000 mL | Freq: Once | INTRAVENOUS | Status: AC
  Administered 2016-08-27: 1000 mL via INTRAVENOUS

## 2016-08-27 MED ORDER — CHLORDIAZEPOXIDE HCL 25 MG PO CAPS: ORAL_CAPSULE | ORAL | 0 refills | Status: DC

## 2016-08-27 MED ORDER — THIAMINE HCL 100 MG/ML IJ SOLN
Freq: Once | INTRAVENOUS | Status: AC
  Administered 2016-08-27: 06:00:00 via INTRAVENOUS
  Filled 2016-08-27: qty 1000

## 2016-08-27 NOTE — ED Provider Notes (Signed)
[facility]-EMERGENCY DEPT Provider Note   CSN: [id number] Arrival date & time: 08/27/16  0144     History   Chief Complaint Chief Complaint  Patient presents with  . Alcohol Problem    HPI Michael Cordova is a 35 y.o. male.  Patient with history of alcoholism, bipolar disorder and PTSD presents after having a seizure x 1. Per EMS report, the patient was told by a friend he had a seizure and no further details are available. He denies having any epileptic history or previous seizure from alcohol withdrawal. He feels he may be withdrawing now. Normal alcohol use is 2-4 fifths of liquor daily which he has not had in 2 days. He reports being shaky, weak, nauseous and having a couple of beers today to help with the shakes.    The history is provided by the patient. No language interpreter was used.    Past Medical History:  Diagnosis Date  . Alcohol abuse   . Bipolar 2 disorder (HCC)   . Manic depression (HCC)   . PTSD (post-traumatic stress disorder)     There are no active problems to display for this patient.   Past Surgical History:  Procedure Laterality Date  . APPENDECTOMY         Home Medications    Prior to Admission medications   Medication Sig Start Date End Date Taking? Authorizing Provider  gabapentin (NEURONTIN) 300 MG capsule Take 300 mg by mouth 3 (three) times daily.    Historical Provider, MD  QUEtiapine Fumarate (SEROQUEL XR) 150 MG 24 hr tablet Take 150 mg by mouth at bedtime.    Historical Provider, MD  traZODone (DESYREL) 50 MG tablet Take 50 mg by mouth at bedtime.    Historical Provider, MD    Family History No family history on file.  Social History Social History  Substance Use Topics  . Smoking status: Current Every Day Smoker    Packs/day: 1.50    Years: 7.00    Types: Cigarettes  . Smokeless tobacco: Not on file  . Alcohol use Yes     Allergies   Penicillins   Review of Systems Review of Systems  Constitutional: Negative for  chills and fever.  HENT: Negative.   Respiratory: Negative.  Negative for shortness of breath.   Cardiovascular: Negative.  Negative for chest pain.  Gastrointestinal: Positive for nausea. Negative for abdominal pain and vomiting.  Musculoskeletal: Negative.   Skin: Negative.   Neurological: Positive for tremors and seizures.     Physical Exam Updated Vital Signs BP 121/82   Pulse 72   Resp 13   Ht 5\' 9"  (1.753 m)   Wt 83.9 kg   SpO2 97%   BMI 27.32 kg/m   Physical Exam  Constitutional: He is oriented to person, place, and time. He appears well-developed and well-nourished.  HENT:  Head: Normocephalic.  Mouth/Throat: Mucous membranes are dry.  Neck: Normal range of motion. Neck supple.  Cardiovascular: Normal rate and regular rhythm.   Pulmonary/Chest: Effort normal and breath sounds normal. He has no wheezes.  Abdominal: Soft. Bowel sounds are normal. There is no tenderness. There is no rebound and no guarding.  Musculoskeletal: Normal range of motion.  Neurological: He is alert and oriented to person, place, and time. He exhibits normal muscle tone. Coordination normal.  Generalized tremors.  Skin: Skin is warm and dry. No rash noted.  Psychiatric: He has a normal mood and affect.     ED Treatments / Results  Labs (all labs ordered are listed, but only abnormal results are displayed) Labs Reviewed  CBC WITH DIFFERENTIAL/PLATELET - Abnormal; Notable for the following:       Result Value   RBC 3.71 (*)    Hemoglobin 12.2 (*)    HCT 35.7 (*)    Platelets 99 (*)    All other components within normal limits  COMPREHENSIVE METABOLIC PANEL - Abnormal; Notable for the following:    Glucose, Bld 106 (*)    Calcium 8.4 (*)    Total Protein 6.3 (*)    AST 116 (*)    All other components within normal limits  ETHANOL - Abnormal; Notable for the following:    Alcohol, Ethyl (B) 59 (*)    All other components within normal limits    EKG  EKG Interpretation None        Radiology No results found.  Procedures Procedures (including critical care time)  Medications Ordered in ED Medications  sodium chloride 0.9 % bolus 1,000 mL (0 mLs Intravenous Stopped 08/27/16 0235)  LORazepam (ATIVAN) injection 1 mg (1 mg Intravenous Given 08/27/16 0159)     Initial Impression / Assessment and Plan / ED Course  I have reviewed the triage vital signs and the nursing notes.  Pertinent labs & imaging results that were available during my care of the patient were reviewed by me and considered in my medical decision making (see chart for details).  Clinical Course     Patient presents after alleged seizure, history of friend who is not present. Patient is a heavy drinker with less intake over 24 hours and is tremulous, making alcohol withdrawal likely with or without seizure. IVF's running. Ativan provided. The patient reports he wants help with drinking cessation. No SI/HI/AVH.   5:10 - Patient has been sleeping well. Less tremulous after Ativan. Wakes easily. No evidence of DT's. Continues to endorse he is not suicidal, homicidal or hallucinating. Will let patient rest and eat breakfast prior to discharge with outpatient resources for alcohol cessation.   Final Clinical Impressions(s) / ED Diagnoses   Final diagnoses:  None   1. Alcohol dependence  New Prescriptions New Prescriptions   No medications on file     Elpidio AnisShari [place]ylvan Sookdeo, PA-C 08/28/16 0415    Shon Batonourtney F Horton, MD 08/29/16 [phone]

## 2016-08-27 NOTE — ED Notes (Signed)
ED Provider at bedside. 

## 2016-08-27 NOTE — ED Notes (Signed)
Patient is up for discharge. MD wants patient to have breakfast before leaving.

## 2016-08-27 NOTE — ED Notes (Signed)
Bed: [id number] Expected date:  Expected time:  Means of arrival:  Comments: 35 yo M  ETOH withdrawal, seizure

## 2016-08-27 NOTE — ED Triage Notes (Signed)
Per EMS pt reports hx of alcohol abuse. Believes he is going through withdrawals. Pt reports a friend told him he witnessed the pt having a seizure which the pt does not remember. Pt is having tremors, denies hx of seizures. Pt reports hx of bipolar disorder. Pt received 4mg  zofran via EMS. Pt typically drink 3-4 fifths a day, reports drinking a few beers today to "keep away the tremors". [place] homeless [place]. CBG 137 via EMS

## 2016-08-28 ENCOUNTER — Emergency Department (HOSPITAL_COMMUNITY): Payer: Self-pay

## 2016-08-28 ENCOUNTER — Emergency Department (HOSPITAL_COMMUNITY)
Admission: EM | Admit: 2016-08-28 | Discharge: 2016-08-29 | Disposition: A | Discharge status: Other Acute Inpatient Hospital | Payer: Self-pay | Attending: Emergency Medicine | Admitting: Emergency Medicine

## 2016-08-28 ENCOUNTER — Encounter (HOSPITAL_COMMUNITY): Payer: Self-pay | Admitting: *Deleted

## 2016-08-28 DIAGNOSIS — S60512A Abrasion of left hand, initial encounter: Secondary | ICD-10-CM | POA: Insufficient documentation

## 2016-08-28 DIAGNOSIS — F1010 Alcohol abuse, uncomplicated: Secondary | ICD-10-CM

## 2016-08-28 DIAGNOSIS — Y929 Unspecified place or not applicable: Secondary | ICD-10-CM | POA: Insufficient documentation

## 2016-08-28 DIAGNOSIS — F17210 Nicotine dependence, cigarettes, uncomplicated: Secondary | ICD-10-CM | POA: Insufficient documentation

## 2016-08-28 DIAGNOSIS — Y999 Unspecified external cause status: Secondary | ICD-10-CM | POA: Insufficient documentation

## 2016-08-28 DIAGNOSIS — Y939 Activity, unspecified: Secondary | ICD-10-CM | POA: Insufficient documentation

## 2016-08-28 DIAGNOSIS — Z79899 Other long term (current) drug therapy: Secondary | ICD-10-CM | POA: Insufficient documentation

## 2016-08-28 DIAGNOSIS — T07XXXA Unspecified multiple injuries, initial encounter: Secondary | ICD-10-CM

## 2016-08-28 DIAGNOSIS — J069 Acute upper respiratory infection, unspecified: Secondary | ICD-10-CM | POA: Insufficient documentation

## 2016-08-28 DIAGNOSIS — S60511A Abrasion of right hand, initial encounter: Secondary | ICD-10-CM | POA: Insufficient documentation

## 2016-08-28 DIAGNOSIS — R45851 Suicidal ideations: Secondary | ICD-10-CM | POA: Insufficient documentation

## 2016-08-28 DIAGNOSIS — X58XXXA Exposure to other specified factors, initial encounter: Secondary | ICD-10-CM | POA: Insufficient documentation

## 2016-08-28 LAB — COMPREHENSIVE METABOLIC PANEL
ALT: 79 U/L — ABNORMAL HIGH (ref 17–63)
ANION GAP: 13 (ref 5–15)
AST: 140 U/L — AB (ref 15–41)
Albumin: 4.4 g/dL (ref 3.5–5.0)
Alkaline Phosphatase: 101 U/L (ref 38–126)
BUN: 15 mg/dL (ref 6–20)
CO2: 23 mmol/L (ref 22–32)
Calcium: 9.2 mg/dL (ref 8.9–10.3)
Chloride: 104 mmol/L (ref 101–111)
Creatinine, Ser: 1.03 mg/dL (ref 0.61–1.24)
GFR calc Af Amer: >60 mL/min (ref >60)
GLUCOSE: 118 mg/dL — AB (ref 65–99)
POTASSIUM: 3.9 mmol/L (ref 3.5–5.1)
Sodium: 140 mmol/L (ref 135–145)
TOTAL PROTEIN: 7.4 g/dL (ref 6.5–8.1)
Total Bilirubin: 0.4 mg/dL (ref 0.3–1.2)

## 2016-08-28 LAB — CBC
HCT: 41.5 % (ref 39.0–52.0)
HEMOGLOBIN: 14.1 g/dL (ref 13.0–17.0)
MCH: 32.9 pg (ref 26.0–34.0)
MCHC: 34 g/dL (ref 30.0–36.0)
MCV: 97 fL (ref 78.0–100.0)
PLATELETS: 143 10*3/uL — AB (ref 150–400)
RBC: 4.28 MIL/uL (ref 4.22–5.81)
RDW: 14 % (ref 11.5–15.5)
WBC: 9.1 10*3/uL (ref 4.0–10.5)

## 2016-08-28 LAB — ETHANOL: Alcohol, Ethyl (B): 379 mg/dL (ref <5)

## 2016-08-28 MED ORDER — LOPERAMIDE HCL 2 MG PO CAPS: 2.0000 mg | ORAL_CAPSULE | ORAL | Status: DC | PRN

## 2016-08-28 MED ORDER — LORAZEPAM 1 MG PO TABS: 1.0000 mg | ORAL_TABLET | Freq: Two times a day (BID) | ORAL | Status: DC

## 2016-08-28 MED ORDER — LORAZEPAM 1 MG PO TABS: 1.0000 mg | ORAL_TABLET | Freq: Three times a day (TID) | ORAL | Status: DC

## 2016-08-28 MED ORDER — LORAZEPAM 1 MG PO TABS
1.0000 mg | ORAL_TABLET | Freq: Four times a day (QID) | ORAL | Status: DC | PRN
  Filled 2016-08-28: qty 1

## 2016-08-28 MED ORDER — LORAZEPAM 1 MG PO TABS
1.0000 mg | ORAL_TABLET | Freq: Four times a day (QID) | ORAL | Status: DC
  Administered 2016-08-28 – 2016-08-29 (×2): 1 mg via ORAL
  Filled 2016-08-28: qty 1

## 2016-08-28 MED ORDER — VITAMIN B-1 100 MG PO TABS
100.0000 mg | ORAL_TABLET | Freq: Every day | ORAL | Status: DC
  Administered 2016-08-29: 100 mg via ORAL
  Filled 2016-08-28: qty 1

## 2016-08-28 MED ORDER — ONDANSETRON 4 MG PO TBDP
4.0000 mg | ORAL_TABLET | Freq: Four times a day (QID) | ORAL | Status: DC | PRN
  Administered 2016-08-29: 4 mg via ORAL
  Filled 2016-08-28: qty 1

## 2016-08-28 MED ORDER — HYDROXYZINE HCL 25 MG PO TABS
25.0000 mg | ORAL_TABLET | Freq: Four times a day (QID) | ORAL | Status: DC | PRN
  Administered 2016-08-29: 25 mg via ORAL
  Filled 2016-08-28: qty 1

## 2016-08-28 MED ORDER — NICOTINE 21 MG/24HR TD PT24
21.0000 mg | MEDICATED_PATCH | Freq: Once | TRANSDERMAL | Status: DC
  Administered 2016-08-28: 21 mg via TRANSDERMAL
  Filled 2016-08-28: qty 1

## 2016-08-28 MED ORDER — THIAMINE HCL 100 MG/ML IJ SOLN
100.0000 mg | Freq: Once | INTRAMUSCULAR | Status: AC
  Administered 2016-08-28: 100 mg via INTRAMUSCULAR
  Filled 2016-08-28: qty 2

## 2016-08-28 MED ORDER — LORAZEPAM 1 MG PO TABS: 1.0000 mg | ORAL_TABLET | Freq: Every day | ORAL | Status: DC

## 2016-08-28 MED ORDER — ADULT MULTIVITAMIN W/MINERALS CH
1.0000 | ORAL_TABLET | Freq: Every day | ORAL | Status: DC
  Administered 2016-08-29: 1 via ORAL
  Filled 2016-08-28: qty 1

## 2016-08-28 NOTE — ED Notes (Signed)
Gave pt a happy meal.

## 2016-08-28 NOTE — ED Notes (Signed)
Staffing called for a sitter undressed placed in scrubs security to wand

## 2016-08-28 NOTE — ED Triage Notes (Signed)
The pt reports that he has a cough cold and some sob since thanksgiving m he now is saying as he laughs that he is suicidal  He just got out of prison

## 2016-08-28 NOTE — ED Notes (Signed)
Dr Dalene SeltzerSchlossman ( EDP ) notified that pt. attempted to leave the ER . Security / Comptrollersecurity notified .

## 2016-08-28 NOTE — ED Triage Notes (Signed)
The pt was apparently drinking alcohol when he came through the door.  It was taken away from him.  [facility] he just got out of prison thanksgiving his parole officer told him to come here

## 2016-08-28 NOTE — ED Notes (Signed)
Pt. became verbally aggressive with nurse despite repeated updates  from nurse on wait time / delay and process.

## 2016-08-28 NOTE — ED Notes (Signed)
Chg nurse notified

## 2016-08-29 ENCOUNTER — Inpatient Hospital Stay (HOSPITAL_COMMUNITY)
Admission: AD | Admit: 2016-08-29 | Discharge: 2016-09-04 | DRG: 897 | Disposition: A | Discharge status: Home / Self Care | Payer: Federal, State, Local not specified - Other | Source: Intra-hospital | Attending: Psychiatry | Admitting: Psychiatry

## 2016-08-29 ENCOUNTER — Encounter (HOSPITAL_COMMUNITY): Payer: Self-pay

## 2016-08-29 DIAGNOSIS — F1024 Alcohol dependence with alcohol-induced mood disorder: Secondary | ICD-10-CM | POA: Diagnosis present

## 2016-08-29 DIAGNOSIS — F10239 Alcohol dependence with withdrawal, unspecified: Principal | ICD-10-CM | POA: Diagnosis present

## 2016-08-29 DIAGNOSIS — F0630 Mood disorder due to known physiological condition, unspecified: Secondary | ICD-10-CM

## 2016-08-29 DIAGNOSIS — R45851 Suicidal ideations: Secondary | ICD-10-CM | POA: Diagnosis present

## 2016-08-29 DIAGNOSIS — F1020 Alcohol dependence, uncomplicated: Secondary | ICD-10-CM | POA: Diagnosis not present

## 2016-08-29 DIAGNOSIS — T148XXA Other injury of unspecified body region, initial encounter: Secondary | ICD-10-CM | POA: Diagnosis not present

## 2016-08-29 DIAGNOSIS — F17210 Nicotine dependence, cigarettes, uncomplicated: Secondary | ICD-10-CM | POA: Diagnosis present

## 2016-08-29 DIAGNOSIS — Z590 Homelessness: Secondary | ICD-10-CM | POA: Diagnosis not present

## 2016-08-29 DIAGNOSIS — T23029A Burn of unspecified degree of unspecified single finger (nail) except thumb, initial encounter: Secondary | ICD-10-CM | POA: Diagnosis not present

## 2016-08-29 DIAGNOSIS — F329 Major depressive disorder, single episode, unspecified: Secondary | ICD-10-CM | POA: Diagnosis present

## 2016-08-29 DIAGNOSIS — Z79899 Other long term (current) drug therapy: Secondary | ICD-10-CM | POA: Diagnosis not present

## 2016-08-29 DIAGNOSIS — Z9889 Other specified postprocedural states: Secondary | ICD-10-CM | POA: Diagnosis not present

## 2016-08-29 MED ORDER — BENZONATATE 100 MG PO CAPS
100.0000 mg | ORAL_CAPSULE | Freq: Once | ORAL | Status: AC
  Administered 2016-08-29: 100 mg via ORAL
  Filled 2016-08-29: qty 1

## 2016-08-29 MED ORDER — GABAPENTIN 300 MG PO CAPS
300.0000 mg | ORAL_CAPSULE | Freq: Three times a day (TID) | ORAL | Status: DC
  Administered 2016-08-29 – 2016-09-04 (×19): 300 mg via ORAL
  Filled 2016-08-29 (×9): qty 1
  Filled 2016-08-29: qty 21
  Filled 2016-08-29 (×2): qty 1
  Filled 2016-08-29: qty 21
  Filled 2016-08-29 (×3): qty 1
  Filled 2016-08-29: qty 21
  Filled 2016-08-29 (×10): qty 1

## 2016-08-29 MED ORDER — TRAZODONE HCL 50 MG PO TABS
50.0000 mg | ORAL_TABLET | Freq: Every day | ORAL | Status: DC
  Administered 2016-08-29 – 2016-09-03 (×6): 50 mg via ORAL
  Filled 2016-08-29 (×2): qty 1
  Filled 2016-08-29: qty 7
  Filled 2016-08-29 (×10): qty 1

## 2016-08-29 MED ORDER — ALUM & MAG HYDROXIDE-SIMETH 200-200-20 MG/5ML PO SUSP: 30.0000 mL | ORAL | Status: DC | PRN

## 2016-08-29 MED ORDER — ADULT MULTIVITAMIN W/MINERALS CH
1.0000 | ORAL_TABLET | Freq: Every day | ORAL | Status: DC
  Administered 2016-08-29 – 2016-09-04 (×7): 1 via ORAL
  Filled 2016-08-29 (×9): qty 1

## 2016-08-29 MED ORDER — VITAMIN B-1 100 MG PO TABS
100.0000 mg | ORAL_TABLET | Freq: Every day | ORAL | Status: DC
  Administered 2016-08-30 – 2016-09-04 (×6): 100 mg via ORAL
  Filled 2016-08-29 (×7): qty 1
  Filled 2016-08-29: qty 7

## 2016-08-29 MED ORDER — HYDROXYZINE HCL 25 MG PO TABS: 25.0000 mg | ORAL_TABLET | Freq: Four times a day (QID) | ORAL | Status: DC | PRN

## 2016-08-29 MED ORDER — LOPERAMIDE HCL 2 MG PO CAPS
2.0000 mg | ORAL_CAPSULE | ORAL | Status: AC | PRN
  Administered 2016-08-30: 4 mg via ORAL
  Filled 2016-08-29: qty 2

## 2016-08-29 MED ORDER — HYDROXYZINE HCL 25 MG PO TABS
25.0000 mg | ORAL_TABLET | Freq: Four times a day (QID) | ORAL | Status: AC | PRN
  Administered 2016-08-30 – 2016-08-31 (×4): 25 mg via ORAL
  Filled 2016-08-29 (×4): qty 1

## 2016-08-29 MED ORDER — LORAZEPAM 1 MG PO TABS
1.0000 mg | ORAL_TABLET | Freq: Two times a day (BID) | ORAL | Status: AC
  Administered 2016-08-31 (×2): 1 mg via ORAL
  Filled 2016-08-29 (×2): qty 1

## 2016-08-29 MED ORDER — ONDANSETRON 4 MG PO TBDP: 4.0000 mg | ORAL_TABLET | Freq: Four times a day (QID) | ORAL | Status: AC | PRN

## 2016-08-29 MED ORDER — LORAZEPAM 1 MG PO TABS
1.0000 mg | ORAL_TABLET | Freq: Four times a day (QID) | ORAL | Status: AC | PRN
  Filled 2016-08-29: qty 1

## 2016-08-29 MED ORDER — MAGNESIUM HYDROXIDE 400 MG/5ML PO SUSP
30.0000 mL | Freq: Every day | ORAL | Status: DC | PRN
  Administered 2016-09-03: 30 mL via ORAL
  Filled 2016-08-29: qty 30

## 2016-08-29 MED ORDER — LORAZEPAM 1 MG PO TABS
1.0000 mg | ORAL_TABLET | Freq: Four times a day (QID) | ORAL | Status: AC
  Administered 2016-08-29 (×3): 1 mg via ORAL
  Filled 2016-08-29 (×3): qty 1

## 2016-08-29 MED ORDER — LORAZEPAM 1 MG PO TABS
1.0000 mg | ORAL_TABLET | Freq: Every day | ORAL | Status: AC
  Administered 2016-09-01: 1 mg via ORAL
  Filled 2016-08-29: qty 1

## 2016-08-29 MED ORDER — ACETAMINOPHEN 325 MG PO TABS
650.0000 mg | ORAL_TABLET | Freq: Four times a day (QID) | ORAL | Status: DC | PRN
  Administered 2016-08-30 (×2): 650 mg via ORAL
  Filled 2016-08-29 (×2): qty 2

## 2016-08-29 MED ORDER — NICOTINE 21 MG/24HR TD PT24
21.0000 mg | MEDICATED_PATCH | Freq: Every day | TRANSDERMAL | Status: DC
  Administered 2016-08-29 – 2016-08-30 (×2): 21 mg via TRANSDERMAL
  Filled 2016-08-29 (×5): qty 1

## 2016-08-29 MED ORDER — LORAZEPAM 1 MG PO TABS
1.0000 mg | ORAL_TABLET | Freq: Three times a day (TID) | ORAL | Status: AC
  Administered 2016-08-30 (×3): 1 mg via ORAL
  Filled 2016-08-29 (×3): qty 1

## 2016-08-29 MED ORDER — THIAMINE HCL 100 MG/ML IJ SOLN
100.0000 mg | Freq: Once | INTRAMUSCULAR | Status: DC
  Filled 2016-08-29: qty 2

## 2016-08-29 NOTE — ED Notes (Signed)
[facility]

## 2016-08-29 NOTE — ED Provider Notes (Signed)
[facility] DEPT Provider Note   CSN: [id number] Arrival date & time: 08/28/16  1916     History   Chief Complaint Chief Complaint  Patient presents with  . Cough  . Psychiatric Evaluation    HPI Julian ReilDavid W Backlund is a 35 y.o. male.  HPI   35yo male with history of bipolar, etoh abuse and withdrawal, smoking, presents with concern for cough, nasal congestion, suicidal ideation with plan to "drink myself to death."   Cough for 3 days, productive of green sputum. Associated nasal congestion.  Reports he is just out of jail, and homeless while he is battling this cough.  Also is concerned that abrasions on his hands may be infected.  Biggest concern however is his depression, SI. Reports he just got out of jail and his whole family is dead.  Has suicidal ideation, with plan to drink himself to death. Hx of bipolar and reports cutting self as a child because of SI.  Is off of medications and not seeing anyone now regarding bipolar. Reports chronic etoh use, with pt using today just PTA and brought etoh into ED. Reports desire to stop. Hx of  Withdrawal seizures.     While in ED, pt also stated to police officer, "I might as well leave and kill myself"   Past Medical History:  Diagnosis Date  . Alcohol abuse   . Bipolar 2 disorder (HCC)   . Manic depression (HCC)   . PTSD (post-traumatic stress disorder)     There are no active problems to display for this patient.   Past Surgical History:  Procedure Laterality Date  . APPENDECTOMY         Home Medications    Prior to Admission medications   Medication Sig Start Date End Date Taking? Authorizing Provider  chlordiazePOXIDE (LIBRIUM) 25 MG capsule 50mg  PO TID x 1D, then 25-50mg  PO BID X 1D, then 25-50mg  PO QD X 1D 08/27/16   Shari Upstill, PA-C  gabapentin (NEURONTIN) 300 MG capsule Take 300 mg by mouth 3 (three) times daily.    Historical Provider, MD  QUEtiapine Fumarate (SEROQUEL XR) 150 MG 24 hr tablet Take 150  mg by mouth at bedtime.    Historical Provider, MD  traZODone (DESYREL) 50 MG tablet Take 50 mg by mouth at bedtime.    Historical Provider, MD    Family History No family history on file.  Social History Social History  Substance Use Topics  . Smoking status: Current Every Day Smoker    Packs/day: 1.50    Years: 7.00    Types: Cigarettes  . Smokeless tobacco: Never Used  . Alcohol use Yes     Allergies   Penicillins   Review of Systems Review of Systems  Constitutional: Negative for fever.  HENT: Positive for congestion and sore throat.   Eyes: Negative for visual disturbance.  Respiratory: Positive for cough and shortness of breath.   Cardiovascular: Negative for chest pain.  Gastrointestinal: Positive for diarrhea, nausea and vomiting. Negative for abdominal pain.  Genitourinary: Negative for difficulty urinating.  Musculoskeletal: Negative for back pain and neck stiffness.  Skin: Positive for wound. Negative for rash.  Neurological: Negative for syncope and headaches.  Psychiatric/Behavioral: Positive for suicidal ideas.     Physical Exam Updated Vital Signs BP 121/68 (BP Location: Left Arm)   Pulse 97   Temp 98.2 F (36.8 C) (Axillary)   Resp 16   Ht 5\' 7"  (1.702 m)   Wt 180 lb (81.6 kg)  SpO2 98%   BMI 28.19 kg/m   Physical Exam  Constitutional: He is oriented to person, place, and time. He appears well-developed and well-nourished. No distress.  HENT:  Head: Normocephalic and atraumatic.  Eyes: Conjunctivae and EOM are normal.  Neck: Normal range of motion.  Cardiovascular: Normal rate, regular rhythm, normal heart sounds and intact distal pulses.  Exam reveals no gallop and no friction rub.   No murmur heard. Pulmonary/Chest: Effort normal and breath sounds normal. No respiratory distress. He has no wheezes. He has no rales.  Abdominal: Soft. He exhibits no distension. There is no tenderness. There is no guarding.  Musculoskeletal: He exhibits no  edema.  Neurological: He is alert and oriented to person, place, and time. He displays tremor (fine).  Skin: Skin is warm and dry. He is not diaphoretic.  Psychiatric: He expresses suicidal ideation. He expresses no homicidal ideation. He expresses suicidal plans. He expresses no homicidal plans.  Multiple deep abrasions/ulcerations to bilateral hands, no surrounding erythema, no fluctuance  Nursing note and vitals reviewed.    ED Treatments / Results  Labs (all labs ordered are listed, but only abnormal results are displayed) Labs Reviewed  COMPREHENSIVE METABOLIC PANEL - Abnormal; Notable for the following:       Result Value   Glucose, Bld 118 (*)    AST 140 (*)    ALT 79 (*)    All other components within normal limits  ETHANOL - Abnormal; Notable for the following:    Alcohol, Ethyl (B) 379 (*)    All other components within normal limits  CBC - Abnormal; Notable for the following:    Platelets 143 (*)    All other components within normal limits  RAPID URINE DRUG SCREEN, HOSP PERFORMED    EKG  EKG Interpretation None       Radiology Dg Chest 2 View  Result Date: 08/28/2016 CLINICAL DATA:  Cough congestion EXAM: CHEST  2 VIEW COMPARISON:  None. FINDINGS: The heart size and mediastinal contours are within normal limits. Both lungs are clear. The visualized skeletal structures are unremarkable. IMPRESSION: No active cardiopulmonary disease. Electronically Signed   By: Jasmine Pang M.D.   On: 08/28/2016 22:44    Procedures Procedures (including critical care time)  Medications Ordered in ED Medications  thiamine (VITAMIN B-1) tablet 100 mg (not administered)  multivitamin with minerals tablet 1 tablet (not administered)  LORazepam (ATIVAN) tablet 1 mg (not administered)  hydrOXYzine (ATARAX/VISTARIL) tablet 25 mg (not administered)  loperamide (IMODIUM) capsule 2-4 mg (not administered)  ondansetron (ZOFRAN-ODT) disintegrating tablet 4 mg (not administered)    LORazepam (ATIVAN) tablet 1 mg (1 mg Oral Given 08/28/16 2309)    Followed by  LORazepam (ATIVAN) tablet 1 mg (not administered)    Followed by  LORazepam (ATIVAN) tablet 1 mg (not administered)    Followed by  LORazepam (ATIVAN) tablet 1 mg (not administered)  nicotine (NICODERM CQ - dosed in mg/24 hours) patch 21 mg (21 mg Transdermal Patch Applied 08/28/16 2348)  thiamine (B-1) injection 100 mg (100 mg Intramuscular Given 08/28/16 2309)  benzonatate (TESSALON) capsule 100 mg (100 mg Oral Given 08/29/16 0056)     Initial Impression / Assessment and Plan / ED Course  I have reviewed the triage vital signs and the nursing notes.  Pertinent labs & imaging results that were available during my care of the patient were reviewed by me and considered in my medical decision making (see chart for details).  Clinical Course  35yo male with history of bipolar, etoh abuse and withdrawal, smoking, presents with concern for cough, nasal congestion, suicidal ideation with plan to "drink myself to death."  Regarding cough, CXR shows no pneumonia, suspect viral URI as etiology of cough, congestion.  Patient with abrasions over hands which are healing without signs of cellulitis or abscess at this time.  Patient arrived to the ED intoxicated, and stated to police in ED that "I might as well just leave and kill myself." Given concern for his safety, instructed nursing and security to prevent him from leaving. He is voluntary at this time, however I would fill out IVC at this time if he does decide to leave given statements made regarding killing himself.  Patient brought back to Pod C. He is medically cleared, CIWA orders placed. [facility] and he meets inpatient criteria and is awaiting placement.  Final Clinical Impressions(s) / ED Diagnoses   Final diagnoses:  Alcohol abuse  Suicidal ideation  Abrasions of multiple sites  Viral upper respiratory tract infection    New Prescriptions New  Prescriptions   No medications on file     Alvira MondayErin Yussuf Sawyers, MD 08/29/16 [phone]

## 2016-08-29 NOTE — ED Notes (Signed)
Telepsych set up at bedside. 

## 2016-08-29 NOTE — Tx Team (Signed)
Initial Treatment Plan 08/29/2016 4:35 PM Michael Cordova W Keir [id number]    PATIENT STRESSORS: Financial difficulties Legal issue Medication change or noncompliance Occupational concerns Substance abuse   PATIENT STRENGTHS: Capable of independent living General fund of knowledge Motivation for treatment/growth Physical Health   PATIENT IDENTIFIED PROBLEMS: Depression  Anxiety  Substance abuse  "Get my head back right"  "Get detoxed"             DISCHARGE CRITERIA:  Improved stabilization in mood, thinking, and/or behavior Verbal commitment to aftercare and medication compliance Withdrawal symptoms are absent or subacute and managed without 24-hour nursing intervention  PRELIMINARY DISCHARGE PLAN: Outpatient therapy Medication management  PATIENT/FAMILY INVOLVEMENT: This treatment plan has been presented to and reviewed with the patient, Michael ReilDavid W Frieson.  The patient and family have been given the opportunity to ask questions and make suggestions.  Levin BaconHeather V Wane Mollett, RN 08/29/2016, 4:35 PM

## 2016-08-29 NOTE — Progress Notes (Signed)
[facility] by Melvenia BeamSimon, PA, attending MD Dr. Mckinley Jewelates. [facility] and report # is [phone]. Notified [facility].  Ilean SkillMeghan Quinntin Malter, MSW, LCSW Clinical Social Work, Disposition  08/29/2016 [phone]
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
## 2016-08-29 NOTE — Progress Notes (Signed)
Patient did not attend NA group meeting tonight.  
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
## 2016-08-29 NOTE — Progress Notes (Signed)
Onalee HuaDavid is a 35 year old male being admitted voluntarily to 307-2 from [facility].  He came to the ED requesting detox and help with his suicidal ideation.  He is currently homeless and has a plan to harm self by "drinking myself to death."  He has no social supports.  He is currently on probation for DWI charge.  He reported that he used to go to AA and was able to maintain sobriety for 9 months but relapsed 4 months ago and has been drinking 4--1/5 bottles of liquor daily.  He currently denies SI and agrees to contract for safety on the unit.  He denies HI.  He does admit to seeing spots and shadows when he is coming off alcohol.  He denies any medical issues and appears to be in no physical distress.  Oriented him to the unit.  Admission paperwork completed and signed.  Belongings searched and secured in locker # 31.  Skin assessment completed and noted abrasions to right 1st finger, left thumb and blisters on left 1st and 2nd tips of fingers.  Q 15 minute checks initiated for safety.  We will monitor the progress towards his goals.

## 2016-08-29 NOTE — ED Notes (Signed)
Patient was given a snack and drink, and a regular diet was ordered for Lunch. 

## 2016-08-29 NOTE — BH Assessment (Signed)
Notified [place], RN and Dr. Wilkie AyeHorton of disposition per Donell SievertSpencer Simon that pt meets inpatient criteria. TTS will seek outside placement.

## 2016-08-30 LAB — LIPID PANEL
CHOL/HDL RATIO: 1.8 ratio
CHOLESTEROL: 233 mg/dL — AB (ref 0–200)
HDL: 133 mg/dL (ref >40)
LDL Cholesterol: 86 mg/dL (ref 0–99)
TRIGLYCERIDES: 71 mg/dL (ref <150)
VLDL: 14 mg/dL (ref 0–40)

## 2016-08-30 MED ORDER — DEXTROMETHORPHAN POLISTIREX ER 30 MG/5ML PO SUER
30.0000 mg | Freq: Two times a day (BID) | ORAL | Status: DC | PRN
  Administered 2016-08-30 – 2016-09-03 (×6): 30 mg via ORAL
  Filled 2016-08-30 (×7): qty 5

## 2016-08-30 MED ORDER — ALBUTEROL SULFATE HFA 108 (90 BASE) MCG/ACT IN AERS
1.0000 | INHALATION_SPRAY | RESPIRATORY_TRACT | Status: DC | PRN
  Administered 2016-08-30 – 2016-09-02 (×4): 2 via RESPIRATORY_TRACT
  Filled 2016-08-30: qty 6.7

## 2016-08-30 MED ORDER — BENZONATATE 100 MG PO CAPS
100.0000 mg | ORAL_CAPSULE | Freq: Three times a day (TID) | ORAL | Status: DC | PRN
  Administered 2016-08-30 – 2016-09-03 (×7): 100 mg via ORAL
  Filled 2016-08-30 (×8): qty 1

## 2016-08-30 MED ORDER — CITALOPRAM HYDROBROMIDE 20 MG PO TABS
20.0000 mg | ORAL_TABLET | Freq: Every day | ORAL | Status: DC
  Administered 2016-08-30 – 2016-09-04 (×6): 20 mg via ORAL
  Filled 2016-08-30 (×7): qty 1
  Filled 2016-08-30: qty 7
  Filled 2016-08-30: qty 1

## 2016-08-30 MED ORDER — DEXTROMETHORPHAN POLISTIREX ER 30 MG/5ML PO SUER
15.0000 mg | Freq: Four times a day (QID) | ORAL | Status: DC | PRN
Start: 2016-08-30 — End: 2016-08-30
  Filled 2016-08-30: qty 5

## 2016-08-30 MED ORDER — BACITRACIN ZINC 500 UNIT/GM EX OINT
TOPICAL_OINTMENT | Freq: Two times a day (BID) | CUTANEOUS | Status: DC
  Administered 2016-08-30 – 2016-09-01 (×5): 16.6667 via TOPICAL
  Administered 2016-09-02: 17:00:00 via TOPICAL
  Administered 2016-09-02 – 2016-09-03 (×3): 16.6667 via TOPICAL
  Administered 2016-09-04: 08:00:00 via TOPICAL
  Filled 2016-08-30: qty 28.35

## 2016-08-30 MED ORDER — NICOTINE POLACRILEX 2 MG MT GUM
2.0000 mg | CHEWING_GUM | OROMUCOSAL | Status: DC | PRN
  Administered 2016-08-30 – 2016-09-04 (×26): 2 mg via ORAL
  Filled 2016-08-30 (×6): qty 1
  Filled 2016-08-30: qty 16
  Filled 2016-08-30 (×3): qty 1

## 2016-08-30 MED ORDER — GUAIFENESIN ER 600 MG PO TB12
600.0000 mg | ORAL_TABLET | Freq: Two times a day (BID) | ORAL | Status: AC
  Administered 2016-08-30 – 2016-09-02 (×6): 600 mg via ORAL
  Filled 2016-08-30 (×7): qty 1
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
## 2016-08-30 NOTE — Plan of Care (Signed)
Problem: Education: Goal: Ability to state activities that reduce stress will improve Outcome: Progressing Nurse discussed anxiety/coping skills with patient.    
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
## 2016-08-30 NOTE — [facility] Suicide Risk Assessment (Signed)
[facility] INPATIENT:  Family/Significant Other Suicide Prevention Education  Suicide Prevention Education:  Education Completed; family friend Michael Cordova [phone]- [phone] ,  (name of family member/significant other) has been identified by the patient as the family member/significant other with whom the patient will be residing, and identified as the person(s) who will aid the patient in the event of a mental health crisis (suicidal ideations/suicide attempt).  With written consent from the patient, the family member/significant other has been provided the following suicide prevention education, prior to the and/or following the discharge of the patient.  The suicide prevention education provided includes the following:  Suicide risk factors  Suicide prevention and interventions  National Suicide Hotline telephone number  [facility] assessment telephone number  [facility] Emergency Assistance 911  [facility] and/or Residential Mobile Crisis Unit telephone number  Request made of family/significant other to:  Remove weapons (e.g., guns, rifles, knives), all items previously/currently identified as safety concern.    Remove drugs/medications (over-the-counter, prescriptions, illicit drugs), all items previously/currently identified as a safety concern.  The family member/significant other verbalizes understanding of the suicide prevention education information provided.  The family member/significant other agrees to remove the items of safety concern listed above.  Michael Cordova 08/30/2016, 1:27 PM

## 2016-08-30 NOTE — Progress Notes (Signed)
Bilateral hands have been medicated and dressed twice this afternoon.  Bacitracin has been ordered from pharmacy and we are waiting for delivery.
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
## 2016-08-30 NOTE — Progress Notes (Signed)
[facility]  08/30/2016 1:27 PM Michael Cordova  MRN:  [id number] Subjective:  "I feel like crap. I slept horrible. Also really wanting to smoke."  Objective: Pt seen and chart reviewed. Pt is alert/oriented x4, calm, cooperative, and appropriate to situation. Pt denies suicidal/homicidal ideation and psychosis and does not appear to be responding to internal stimuli. Pt states he feels terrible from alcohol withdrawal in addition to having an upper respiratory infection.   Principal Problem: Alcohol use disorder, severe, dependence (HCC) Diagnosis:   Patient Active Problem List   Diagnosis Date Noted  . Alcohol use disorder, severe, dependence (HCC) [F10.20] 08/29/2016   Total Time spent with patient: 15 minutes  Past Psychiatric History: see H&P  Past Medical History:  Past Medical History:  Diagnosis Date  . Alcohol abuse   . Bipolar 2 disorder (HCC)   . Manic depression (HCC)   . PTSD (post-traumatic stress disorder)     Past Surgical History:  Procedure Laterality Date  . APPENDECTOMY     Family History: History reviewed. No pertinent family history. Family Psychiatric  History: denies Social History:  History  Alcohol Use  . Yes    Comment: drinks 4- fifths of liquor daily     History  Drug Use  . Types: Marijuana    Social History   Social History  . Marital status: Single    Spouse name: N/A  . Number of children: N/A  . Years of education: N/A   Social History Main Topics  . Smoking status: Current Every Day Smoker    Packs/day: 1.50    Years: 7.00    Types: Cigarettes  . Smokeless tobacco: Never Used  . Alcohol use Yes     Comment: drinks 4- fifths of liquor daily  . Drug use:     Types: Marijuana  . Sexual activity: Not Currently   Other Topics Concern  . None   Social History Narrative  . None   Additional Social History:    Pain Medications: none Prescriptions: none Over the Counter: none  History of alcohol / drug use?:  Yes Longest period of sobriety (when/how long): 9 1/2 months  Negative Consequences of Use: Financial, Legal Withdrawal Symptoms: Cramps, Fever / Chills, Sweats, Tremors Name of Substance 1: Alcohol  1 - Age of First Use: unknown 1 - Amount (size/oz): "I drink four 1/5ths per day" 1 - Frequency: daily 1 - Duration: last 4 months 1 - Last Use / Amount: 08/28/16 Name of Substance 2: Marijuana  2 - Age of First Use: 12 2 - Amount (size/oz): "1/2 joint" 2 - Frequency: daily  2 - Duration: ongoing  2 - Last Use / Amount: unknown                 Sleep: Poor  Appetite:  Fair  Current Medications: Current Facility-Administered Medications  Medication Dose Route Frequency Provider Last Rate Last Dose  . acetaminophen (TYLENOL) tablet 650 mg  650 mg Oral Q6H PRN Sanjuana KavaAgnes I Nwoko, NP   650 mg at 08/30/16 0805  . albuterol (PROVENTIL HFA;VENTOLIN HFA) 108 (90 Base) MCG/ACT inhaler 1-2 puff  1-2 puff Inhalation Q4H PRN Beau FannyJohn C Withrow, FNP      . alum & mag hydroxide-simeth (MAALOX/MYLANTA) 200-200-20 MG/5ML suspension 30 mL  30 mL Oral Q4H PRN Sanjuana KavaAgnes I Nwoko, NP      . benzonatate (TESSALON) capsule 100 mg  100 mg Oral Q8H PRN Beau FannyJohn C Withrow, FNP      .  citalopram (CELEXA) tablet 20 mg  20 mg Oral Daily Neysa Hotter, MD   20 mg at 08/30/16 1127  . dextromethorphan (DELSYM) 30 MG/5ML liquid 30 mg  30 mg Oral BID PRN Craige Cotta, MD   30 mg at 08/30/16 1129  . gabapentin (NEURONTIN) capsule 300 mg  300 mg Oral TID Sanjuana Kava, NP   300 mg at 08/30/16 1127  . guaiFENesin (MUCINEX) 12 hr tablet 600 mg  600 mg Oral BID Beau Fanny, FNP      . hydrOXYzine (ATARAX/VISTARIL) tablet 25 mg  25 mg Oral Q6H PRN Sanjuana Kava, NP   25 mg at 08/30/16 1132  . loperamide (IMODIUM) capsule 2-4 mg  2-4 mg Oral PRN Sanjuana Kava, NP      . LORazepam (ATIVAN) tablet 1 mg  1 mg Oral Q6H PRN Sanjuana Kava, NP      . LORazepam (ATIVAN) tablet 1 mg  1 mg Oral TID Sanjuana Kava, NP   1 mg at 08/30/16 1209    Followed by  . [START ON 08/31/2016] LORazepam (ATIVAN) tablet 1 mg  1 mg Oral BID Sanjuana Kava, NP       Followed by  . [START ON 09/01/2016] LORazepam (ATIVAN) tablet 1 mg  1 mg Oral Daily Sanjuana Kava, NP      . magnesium hydroxide (MILK OF MAGNESIA) suspension 30 mL  30 mL Oral Daily PRN Sanjuana Kava, NP      . multivitamin with minerals tablet 1 tablet  1 tablet Oral Daily Sanjuana Kava, NP   1 tablet at 08/30/16 0800  . nicotine polacrilex (NICORETTE) gum 2 mg  2 mg Oral PRN Beau Fanny, FNP      . ondansetron (ZOFRAN-ODT) disintegrating tablet 4 mg  4 mg Oral Q6H PRN Sanjuana Kava, NP      . thiamine (B-1) injection 100 mg  100 mg Intramuscular Once Sanjuana Kava, NP      . thiamine (VITAMIN B-1) tablet 100 mg  100 mg Oral Daily Sanjuana Kava, NP   100 mg at 08/30/16 0800  . traZODone (DESYREL) tablet 50 mg  50 mg Oral QHS Sanjuana Kava, NP   50 mg at 08/29/16 2124    Lab Results:  Results for orders placed or performed during the hospital encounter of 08/29/16 (from the past 48 hour(s))  Lipid panel     Status: Abnormal   Collection Time: 08/30/16  6:22 AM  Result Value Ref Range   Cholesterol 233 (H) 0 - 200 mg/dL   Triglycerides 71 <161 mg/dL   HDL 096 >04 mg/dL   Total CHOL/HDL Ratio 1.8 RATIO   VLDL 14 0 - 40 mg/dL   LDL Cholesterol 86 0 - 99 mg/dL    Comment:        Total Cholesterol/HDL:CHD Risk Coronary Heart Disease Risk Table                     Men   Women  1/2 Average Risk   3.4   3.3  Average Risk       5.0   4.4  2 X Average Risk   9.6   7.1  3 X Average Risk  23.4   11.0        Use the calculated Patient Ratio above and the CHD Risk Table to determine the patient's CHD Risk.  ATP III CLASSIFICATION (LDL):  <100     mg/dL   Optimal  161-096  mg/dL   Near or Above                    Optimal  130-159  mg/dL   Borderline  045-409  mg/dL   High  >811     mg/dL   Very High Performed at [facility]     Blood Alcohol level:  Lab  Results  Component Value Date   ETH 379 (HH) 08/28/2016   ETH 59 (H) 08/27/2016    Metabolic Disorder Labs: No results found for: HGBA1C, MPG No results found for: PROLACTIN Lab Results  Component Value Date   CHOL 233 (H) 08/30/2016   TRIG 71 08/30/2016   HDL 133 08/30/2016   CHOLHDL 1.8 08/30/2016   VLDL 14 08/30/2016   LDLCALC 86 08/30/2016    Physical Findings: AIMS: Facial and Oral Movements Muscles of Facial Expression: None, normal Lips and Perioral Area: None, normal Jaw: None, normal Tongue: None, normal,Extremity Movements Upper (arms, wrists, hands, fingers): None, normal Lower (legs, knees, ankles, toes): None, normal, Trunk Movements Neck, shoulders, hips: None, normal, Overall Severity Severity of abnormal movements (highest score from questions above): None, normal Incapacitation due to abnormal movements: None, normal Patient's awareness of abnormal movements (rate only patient's report): No Awareness, Dental Status Current problems with teeth and/or dentures?: No Does patient usually wear dentures?: No  CIWA:  CIWA-Ar Total: 12 COWS:     Musculoskeletal: Strength & Muscle Tone: within normal limits Gait & Station: normal Patient leans: N/A  Psychiatric Specialty Exam: Physical Exam  Review of Systems  Psychiatric/Behavioral: Positive for depression and substance abuse. Negative for hallucinations and suicidal ideas. The patient is nervous/anxious and has insomnia.   All other systems reviewed and are negative.   Blood pressure 124/84, pulse 92, temperature 98.6 F (37 C), temperature source Oral, resp. rate 18, height 5\' 9"  (1.753 m), weight 77.6 kg (171 lb), SpO2 97 %.Body mass index is 25.25 kg/m.  General Appearance: Casual and Fairly Groomed  Eye Contact:  Good  Speech:  Clear and Coherent and Normal Rate  Volume:  Normal  Mood:  Anxious and Depressed  Affect:  Appropriate, Congruent and Depressed  Thought Process:  Coherent, Goal Directed,  Linear and Descriptions of Associations: Intact  Orientation:  Full (Time, Place, and Person)  Thought Content:  Focused on withdrawal symptoms  Suicidal Thoughts:  No  Homicidal Thoughts:  No  Memory:  Immediate;   Fair Recent;   Fair Remote;   Fair  Judgement:  Fair  Insight:  Fair  Psychomotor Activity:  Normal  Concentration:  Concentration: Fair and Attention Span: Fair  Recall:  [employer] of Knowledge:  Fair  Language:  Fair  Akathisia:  No  Handed:    AIMS (if indicated):     Assets:  Communication Skills Desire for Improvement Resilience Social Support  ADL's:  Intact  Cognition:  WNL  Sleep:  Number of Hours: 6.75   Treatment Plan Summary: Alcohol use disorder, severe, dependence (HCC) unstable, treated as below:  Medications:  -start tessalon perles 100mg  po q8h prn cough -continue celexa 20mg  po daily for depression -continue neurontin 300mg  po tid for alcohol withdrawal -continue ativan/CIWA order -start mucinex 600mg  po bid x 3 days -start albuterol hfa inhaler 1-2 puffs q4h prn shob -continue trazodone 50mg  po qhs prn insomnia -continue bacitracin to knuckle abrasions  Beau FannyWithrow, John C, FNP 08/30/2016, 1:27 PM   Agree with NP note as above  Nehemiah MassedFernando Christia Coaxum, MD

## 2016-08-30 NOTE — Plan of Care (Signed)
Problem: Education: Goal: Utilization of techniques to improve thought processes will improve Outcome: Progressing Nurse discussed depression/coping skills with patient.    

## 2016-08-30 NOTE — Tx Team (Signed)
Interdisciplinary Treatment and Diagnostic Plan Update  08/30/2016 Time of Session: 9:30am Michael Cordova MRN: [id number]  Principal Diagnosis: Alcohol use disorder, severe, dependence ([facility])   Current Medications:  Current Facility-Administered Medications  Medication Dose Route Frequency Provider Last Rate Last Dose  . acetaminophen (TYLENOL) tablet 650 mg  650 mg Oral Q6H PRN Encarnacion Slates, NP   650 mg at 08/30/16 0805  . alum & mag hydroxide-simeth (MAALOX/MYLANTA) 200-200-20 MG/5ML suspension 30 mL  30 mL Oral Q4H PRN Encarnacion Slates, NP      . gabapentin (NEURONTIN) capsule 300 mg  300 mg Oral TID Encarnacion Slates, NP   300 mg at 08/30/16 0758  . hydrOXYzine (ATARAX/VISTARIL) tablet 25 mg  25 mg Oral Q6H PRN Encarnacion Slates, NP      . loperamide (IMODIUM) capsule 2-4 mg  2-4 mg Oral PRN Encarnacion Slates, NP      . LORazepam (ATIVAN) tablet 1 mg  1 mg Oral Q6H PRN Encarnacion Slates, NP      . LORazepam (ATIVAN) tablet 1 mg  1 mg Oral TID Encarnacion Slates, NP   1 mg at 08/30/16 0759   Followed by  . [START ON 08/31/2016] LORazepam (ATIVAN) tablet 1 mg  1 mg Oral BID Encarnacion Slates, NP       Followed by  . [START ON 09/01/2016] LORazepam (ATIVAN) tablet 1 mg  1 mg Oral Daily Encarnacion Slates, NP      . magnesium hydroxide (MILK OF MAGNESIA) suspension 30 mL  30 mL Oral Daily PRN Encarnacion Slates, NP      . multivitamin with minerals tablet 1 tablet  1 tablet Oral Daily Encarnacion Slates, NP   1 tablet at 08/30/16 0800  . nicotine (NICODERM CQ - dosed in mg/24 hours) patch 21 mg  21 mg Transdermal Daily Myer Peer Cobos, MD   21 mg at 08/30/16 0800  . ondansetron (ZOFRAN-ODT) disintegrating tablet 4 mg  4 mg Oral Q6H PRN Encarnacion Slates, NP      . thiamine (B-1) injection 100 mg  100 mg Intramuscular Once Encarnacion Slates, NP      . thiamine (VITAMIN B-1) tablet 100 mg  100 mg Oral Daily Encarnacion Slates, NP   100 mg at 08/30/16 0800  . traZODone (DESYREL) tablet 50 mg  50 mg Oral QHS Encarnacion Slates, NP   50 mg at 08/29/16  2124   PTA Medications: Prescriptions Prior to Admission  Medication Sig Dispense Refill Last Dose  . chlordiazePOXIDE (LIBRIUM) 25 MG capsule 33m PO TID x 1D, then 25-566mPO BID X 1D, then 25-503mO QD X 1D (Patient not taking: Reported on 08/29/2016) 10 capsule 0 Not Taking at Unknown time  . gabapentin (NEURONTIN) 300 MG capsule Take 300 mg by mouth 3 (three) times daily.   Not Taking at Unknown time  . QUEtiapine Fumarate (SEROQUEL XR) 150 MG 24 hr tablet Take 150 mg by mouth at bedtime.   Not Taking at Unknown time  . traZODone (DESYREL) 50 MG tablet Take 50 mg by mouth at bedtime.   Not Taking at Unknown time    Patient Stressors: Financial difficulties Legal issue Medication change or noncompliance Occupational concerns Substance abuse  Patient Strengths: Capable of independent living General fund of knowledge Motivation for treatment/growth Physical Health  Treatment Modalities: Medication Management, Group therapy, Case management,  1 to 1 session with clinician, Psychoeducation, Recreational therapy.   Physician  Treatment Plan for Primary Diagnosis: Alcohol use disorder, severe, dependence ([place]) Long Term Goal(s): Improvement in symptoms so as ready for discharge Improvement in symptoms so as ready for discharge   Short Term Goals: Ability to demonstrate self-control will improve Ability to identify and develop effective coping behaviors will improve Ability to demonstrate self-control will improve Ability to identify and develop effective coping behaviors will improve  Medication Management: Evaluate patient's response, side effects, and tolerance of medication regimen.  Therapeutic Interventions: 1 to 1 sessions, Unit Group sessions and Medication administration.  Evaluation of Outcomes: Not Met    RN Treatment Plan for Primary Diagnosis: Alcohol use disorder, severe, dependence ([place]) Long Term Goal(s): Knowledge of disease and therapeutic regimen to  maintain health will improve  Short Term Goals: Ability to remain free from injury will improve, Ability to disclose and discuss suicidal ideas, Ability to identify and develop effective coping behaviors will improve and Compliance with prescribed medications will improve  Medication Management: RN will administer medications as ordered by provider, will assess and evaluate patient's response and provide education to patient for prescribed medication. RN will report any adverse and/or side effects to prescribing provider.  Therapeutic Interventions: 1 on 1 counseling sessions, Psychoeducation, Medication administration, Evaluate responses to treatment, Monitor vital signs and CBGs as ordered, Perform/monitor CIWA, COWS, AIMS and Fall Risk screenings as ordered, Perform wound care treatments as ordered.  Evaluation of Outcomes: Not Met   LCSW Treatment Plan for Primary Diagnosis: Alcohol use disorder, severe, dependence ([place]) Long Term Goal(s): Safe transition to appropriate next level of care at discharge, Engage patient in therapeutic group addressing interpersonal concerns.  Short Term Goals: Engage patient in aftercare planning with referrals and resources, Increase social support, Increase emotional regulation, Identify triggers associated with mental health/substance abuse issues and Increase skills for wellness and recovery  Therapeutic Interventions: Assess for all discharge needs, 1 to 1 time with Social worker, Explore available resources and support systems, Assess for adequacy in community support network, Educate family and significant other(s) on suicide prevention, Complete Psychosocial Assessment, Interpersonal group therapy.  Evaluation of Outcomes: Not Met   Progress in Treatment :  Attending groups: Continuing to assess  Participating in groups: Continuing to assess  Taking medication as prescribed: Yes, MD continuing to assess for appropriate medication  regimen  Toleration medication: Yes  Family/Significant other contact made: Treatment team assessing for appropriate contacts  Patient understands diagnosis: Yes  Discussing patient identified problems/goals with staff: Yes  Medical problems stabilized or resolved: Yes  Denies suicidal/homicidal ideation: Treatment team continuing to asses  Issues/concerns per patient self-inventory: None reported  Other: N/A  New problem(s) identified: None reported at this time    New Short Term/Long Term Goal(s): None at this time    Discharge Plan or Barriers: Patient interested in residential treatment.     Reason for Continuation of Hospitalization: Anxiety Depression Medication stabilization Suicidal Ideations Withdrawal symptoms  Estimated Length of Stay: 3-5 days    Attendees:  Patient:   Physician: Dr. Parke Poisson, Dr. Modesta Messing, MD  08/30/2016   9:30am  Nursing: Tonye Pearson, RN 08/30/2016 9:30am  RN Care Manager: Lars Pinks, CM  08/30/2016 9:30am  Social Workers: Peri Maris, LCSW, Tilden Fossa, LCSW, [place], LCSW   08/30/2016 9:30am  Nurse Pratictioners:  Lindell Spar, NP 08/30/2016 9:30am  Other:  08/30/2016 9:30am    Scribe for Treatment Team: Tilden Fossa, [place] Worker [facility] [phone]

## 2016-08-30 NOTE — [facility] Group Notes (Signed)
The focus of this group is to educate the patient on the purpose and policies of crisis stabilization and provide a format to answer questions about their admission.  The group details unit policies and expectations of patients while admitted.  Patient did not attend 0900 nurse education orientation group morning.  Patient stayed in bed.
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
## 2016-08-30 NOTE — Progress Notes (Signed)
Pt has been in bed all evening.  He is a new pt to the unit earlier today.  He is here for alcohol detox.  He reports he is having moderate withdrawal symptoms at this time.  He has been medicated per orders.  He denies SI/HI/AVH at this time, although he says when he is in detox, he sometimes has hallucinations.  Pt was encouraged to make his needs known to staff.  Support and encouragement offered.  Discharge plans are in process.  Safety maintained with q15 minute checks.

## 2016-08-30 NOTE — Progress Notes (Signed)
D:  Patient's self inventory sheet, patient has poor sleep, sleep medication not helpful.  Good appetite, low energy level, poor concentration.  Rated depression and anxiety 5, hopeless 2.  Withdrawals, tremors, chilling, cravings, cramping, agitation, nausea, runny nose, irritability.  Denied SI.  Physical problems, pain, dizziness, sore throat, coughing.  Physical pain, worst pain in past 24 hours #4, throat, hands.  Goal is cough and hands.  Will discuss with MD. No discharge plans. A:  Medications administered per MD orders.  Emotional support and encouragement given patient. R:  Patient denied SI and HI, contracts for safety.  Denied A/V hallucinations.  Safety maintained with 15 minute checks.

## 2016-08-30 NOTE — [facility] Group Notes (Signed)
[facility] LCSW Group Therapy 10AM   Type of Therapy: Group Therapy  Participation Level: Did Not Attend. Patient invited to participate but declined.   Samuella BruinKristin Alexea Blase, MSW, LCSW Clinical Social Worker [facility] [phone]

## 2016-08-31 DIAGNOSIS — Z9889 Other specified postprocedural states: Secondary | ICD-10-CM

## 2016-08-31 LAB — HEPATIC FUNCTION PANEL
ALBUMIN: 4.6 g/dL (ref 3.5–5.0)
ALT: 60 U/L (ref 17–63)
AST: 77 U/L — ABNORMAL HIGH (ref 15–41)
Alkaline Phosphatase: 72 U/L (ref 38–126)
Bilirubin, Direct: 0.1 mg/dL (ref 0.1–0.5)
Indirect Bilirubin: 0.5 mg/dL (ref 0.3–0.9)
TOTAL PROTEIN: 7.9 g/dL (ref 6.5–8.1)
Total Bilirubin: 0.6 mg/dL (ref 0.3–1.2)

## 2016-08-31 LAB — TSH: TSH: 5.56 u[IU]/mL — ABNORMAL HIGH (ref 0.350–4.500)

## 2016-08-31 LAB — HEMOGLOBIN A1C
Hgb A1c MFr Bld: 5 % (ref 4.8–5.6)
MEAN PLASMA GLUCOSE: 97 mg/dL

## 2016-08-31 MED ORDER — ACAMPROSATE CALCIUM 333 MG PO TBEC
666.0000 mg | DELAYED_RELEASE_TABLET | Freq: Three times a day (TID) | ORAL | Status: DC
  Administered 2016-09-01 – 2016-09-04 (×11): 666 mg via ORAL
  Filled 2016-08-31 (×2): qty 2
  Filled 2016-08-31: qty 42
  Filled 2016-08-31: qty 2
  Filled 2016-08-31: qty 42
  Filled 2016-08-31 (×11): qty 2
  Filled 2016-08-31: qty 42
  Filled 2016-08-31: qty 2

## 2016-08-31 NOTE — Progress Notes (Signed)
Recreation Therapy Notes  Date: 08/31/16 Time: 0930 Location: [address] Hall Group Room  Group Topic: Stress Management  Goal Area(s) Addresses:  Patient will verbalize importance of using healthy stress management.  Patient will identify positive emotions associated with healthy stress management.   Intervention: Calm App  Activity :  Gratitude Meditation.  LRT introduced the stress management technique of meditation to group.  LRT played the meditation from the Calm app to allow patients to focus on things they should be grateful for and not the things they don't have.  Patients were to follow along as the meditation played to engage in the technique.  Education:  Stress Management, Discharge Planning.   Education Outcome: Acknowledges edcuation/In group clarification offered/Needs additional education  Clinical Observations/Feedback: Pt did not attend group.   Caroll RancherMarjette Sabriah Hobbins, LRT/CTRS         Caroll RancherLindsay, Hector Venne A 08/31/2016 11:36 AM

## 2016-08-31 NOTE — Progress Notes (Signed)
D:  Patient's self inventory sheet, patient sleeps good, sleep medication is helpful.  Good appetite, low energy level, poor concentration.  Rated depression 5, denied hopeless, anxiety 2.  Withdrawals, tremors, diarrhea, chilling, agitation, runny nose, irritability.  Denied SI.  Physical problems, pain, dizzy, headaches, hands.  Pain, worst pain in past 24 hours #2, hands, pain medication is helpful.  Goal is attend meetings.  Plans to sleep.  Does have discharge plans. A:  Medications administered per MD orders.  Emotional support and encouragement given patient. R:  Denied SI and HI, contracts for safety.  Denied A/V hallucinations.  Safety maintained with 15 minute checks.

## 2016-08-31 NOTE — [facility] Group Notes (Signed)
[facility] LCSW Group Therapy  08/31/2016 2:27 PM  Type of Therapy:  Group Therapy  Participation Level:  Invited, chose not to attend.  Modes of Intervention:  Discussion, Exploration and Problem-solving  Summary of Progress/Problems: Feelings around Relapse. Group members discussed the meaning of relapse and shared personal stories of relapse, how it affected them and others, and how they perceived themselves during this time. Group members were encouraged to identify triggers, warning signs and coping skills used when facing the possibility of relapse. Social supports were discussed and explored in detail. Post Acute Withdrawal Syndrome (handout provided) was introduced and examined. Pt's were encouraged to ask questions, talk about key points associated with PAWS, and process this information in terms of relapse prevention.    Saree Krogh C Tracy Kinner 08/31/2016, 2:27 PM   

## 2016-08-31 NOTE — Progress Notes (Addendum)
[facility] MD Progress Note  08/31/2016 5:38 PM DANEIL BEEM  MRN:  [id number] Subjective:  Patient reports ongoing symptoms of WDL but states " I think I am over the hump of the withdrawal, yesterday was worse". He reports still feeling flushed and tremulous. Also endorses cravings for alcohol, but states " I am tired of drinking" and expresses a desire to go to rehab after discharge to continue working on recovery efforts .  Objective:  I have discussed case with treatment team and have met with patient . Patient presents in bed, but fully alert and attentive. He reports feeling some flushing, which is apparent, and feeling vaguely tremulous, but at this time no distal tremors are noted. Vitals are stable. Denies visual disturbances. He does state, as above, that he is starting to feel better today.  No disruptive or agitated behaviors on unit, but  Group/milieu participation has been limited, spending most time in his room. Denies medication side effects. He is future oriented, and wants to go to a rehab after discharge.  Principal Problem: Alcohol use disorder, severe, dependence ([place]) Diagnosis:   Patient Active Problem List   Diagnosis Date Noted  . Alcohol use disorder, severe, dependence ([place]) [F10.20] 08/29/2016   Total Time spent with patient: 20 minutes  Past Psychiatric History: see H&P  Past Medical History:  Past Medical History:  Diagnosis Date  . Alcohol abuse   . Bipolar 2 disorder ([place])   . Manic depression ([place])   . PTSD (post-traumatic stress disorder)     Past Surgical History:  Procedure Laterality Date  . APPENDECTOMY     Family History: History reviewed. No pertinent family history. Family Psychiatric  History: denies Social History:  History  Alcohol Use  . Yes    Comment: drinks 4- fifths of liquor daily     History  Drug Use  . Types: Marijuana    Social History   Social History  . Marital status: Single    Spouse name: N/A  . Number of  children: N/A  . Years of education: N/A   Social History Main Topics  . Smoking status: Current Every Day Smoker    Packs/day: 1.50    Years: 7.00    Types: Cigarettes  . Smokeless tobacco: Never Used  . Alcohol use Yes     Comment: drinks 4- fifths of liquor daily  . Drug use:     Types: Marijuana  . Sexual activity: Not Currently   Other Topics Concern  . None   Social History Narrative  . None   Additional Social History:    Pain Medications: none Prescriptions: none Over the Counter: none  History of alcohol / drug use?: Yes Longest period of sobriety (when/how long): 9 1/2 months  Negative Consequences of Use: Financial, Legal Withdrawal Symptoms: Cramps, Fever / Chills, Sweats, Tremors Name of Substance 1: Alcohol  1 - Age of First Use: unknown 1 - Amount (size/oz): "I drink four 1/5ths per day" 1 - Frequency: daily 1 - Duration: last 4 months 1 - Last Use / Amount: 08/28/16 Name of Substance 2: Marijuana  2 - Age of First Use: 12 2 - Amount (size/oz): "1/2 joint" 2 - Frequency: daily  2 - Duration: ongoing  2 - Last Use / Amount: unknown   Sleep: fair  Appetite:  Improving   Current Medications: Current Facility-Administered Medications  Medication Dose Route Frequency Provider Last Rate Last Dose  . acetaminophen (TYLENOL) tablet 650 mg  650 mg Oral  Q6H PRN Encarnacion Slates, NP   650 mg at 08/30/16 1904  . albuterol (PROVENTIL HFA;VENTOLIN HFA) 108 (90 Base) MCG/ACT inhaler 1-2 puff  1-2 puff Inhalation Q4H PRN Benjamine Mola, FNP   2 puff at 08/31/16 1124  . alum & mag hydroxide-simeth (MAALOX/MYLANTA) 200-200-20 MG/5ML suspension 30 mL  30 mL Oral Q4H PRN Encarnacion Slates, NP      . bacitracin ointment   Topical BID Jenne Campus, MD   16.1096 application at 04/54/09 [phone]  . benzonatate (TESSALON) capsule 100 mg  100 mg Oral Q8H PRN Benjamine Mola, FNP   100 mg at 08/31/16 1302  . citalopram (CELEXA) tablet 20 mg  20 mg Oral Daily Norman Clay, MD   20  mg at 08/31/16 1478  . dextromethorphan (DELSYM) 30 MG/5ML liquid 30 mg  30 mg Oral BID PRN Jenne Campus, MD   30 mg at 08/31/16 1124  . gabapentin (NEURONTIN) capsule 300 mg  300 mg Oral TID Encarnacion Slates, NP   300 mg at 08/31/16 1728  . guaiFENesin (MUCINEX) 12 hr tablet 600 mg  600 mg Oral BID Benjamine Mola, FNP   600 mg at 08/31/16 1728  . hydrOXYzine (ATARAX/VISTARIL) tablet 25 mg  25 mg Oral Q6H PRN Encarnacion Slates, NP   25 mg at 08/31/16 1301  . loperamide (IMODIUM) capsule 2-4 mg  2-4 mg Oral PRN Encarnacion Slates, NP   4 mg at 08/30/16 1411  . LORazepam (ATIVAN) tablet 1 mg  1 mg Oral Q6H PRN Encarnacion Slates, NP      . Derrill Memo ON 09/01/2016] LORazepam (ATIVAN) tablet 1 mg  1 mg Oral Daily Encarnacion Slates, NP      . magnesium hydroxide (MILK OF MAGNESIA) suspension 30 mL  30 mL Oral Daily PRN Encarnacion Slates, NP      . multivitamin with minerals tablet 1 tablet  1 tablet Oral Daily Encarnacion Slates, NP   1 tablet at 08/31/16 [id number]  . nicotine polacrilex (NICORETTE) gum 2 mg  2 mg Oral PRN Benjamine Mola, FNP   2 mg at 08/31/16 1729  . ondansetron (ZOFRAN-ODT) disintegrating tablet 4 mg  4 mg Oral Q6H PRN Encarnacion Slates, NP      . thiamine (B-1) injection 100 mg  100 mg Intramuscular Once Encarnacion Slates, NP      . thiamine (VITAMIN B-1) tablet 100 mg  100 mg Oral Daily Encarnacion Slates, NP   100 mg at 08/31/16 2130  . traZODone (DESYREL) tablet 50 mg  50 mg Oral QHS Encarnacion Slates, NP   50 mg at 08/30/16 2101    Lab Results:  Results for orders placed or performed during the hospital encounter of 08/29/16 (from the past 48 hour(s))  Hemoglobin A1c     Status: None   Collection Time: 08/30/16  6:22 AM  Result Value Ref Range   Hgb A1c MFr Bld 5.0 4.8 - 5.6 %    Comment: (NOTE)         Pre-diabetes: 5.7 - 6.4         Diabetes: >6.4         Glycemic control for adults with diabetes: <7.0    Mean Plasma Glucose 97 mg/dL    Comment: (NOTE) Performed At: [facility] [address]  [place], [place] [id number] Lindon Romp MD EX:[id number] Performed at [facility]   Lipid panel  Status: Abnormal   Collection Time: 08/30/16  6:22 AM  Result Value Ref Range   Cholesterol 233 (H) 0 - 200 mg/dL   Triglycerides 71 <150 mg/dL   HDL 133 >40 mg/dL   Total CHOL/HDL Ratio 1.8 RATIO   VLDL 14 0 - 40 mg/dL   LDL Cholesterol 86 0 - 99 mg/dL    Comment:        Total Cholesterol/HDL:CHD Risk Coronary Heart Disease Risk Table                     Men   Women  1/2 Average Risk   3.4   3.3  Average Risk       5.0   4.4  2 X Average Risk   9.6   7.1  3 X Average Risk  23.4   11.0        Use the calculated Patient Ratio above and the CHD Risk Table to determine the patient's CHD Risk.        ATP III CLASSIFICATION (LDL):  <100     mg/dL   Optimal  100-129  mg/dL   Near or Above                    Optimal  130-159  mg/dL   Borderline  160-189  mg/dL   High  >190     mg/dL   Very High Performed at [facility]   Hepatic function panel     Status: Abnormal   Collection Time: 08/31/16  6:08 AM  Result Value Ref Range   Total Protein 7.9 6.5 - 8.1 g/dL   Albumin 4.6 3.5 - 5.0 g/dL   AST 77 (H) 15 - 41 U/L   ALT 60 17 - 63 U/L   Alkaline Phosphatase 72 38 - 126 U/L   Total Bilirubin 0.6 0.3 - 1.2 mg/dL   Bilirubin, Direct 0.1 0.1 - 0.5 mg/dL   Indirect Bilirubin 0.5 0.3 - 0.9 mg/dL    Comment: Performed at [facility]  TSH     Status: Abnormal   Collection Time: 08/31/16  6:08 AM  Result Value Ref Range   TSH 5.560 (H) 0.350 - 4.500 uIU/mL    Comment: Performed by a 3rd Generation assay with a functional sensitivity of <=0.01 uIU/mL. Performed at [facility]     Blood Alcohol level:  Lab Results  Component Value Date   ETH [facility]) 08/28/2016   ETH 59 (H) 65/99/3570    Metabolic Disorder Labs: Lab Results  Component Value Date   HGBA1C 5.0 08/30/2016   MPG 97 08/30/2016   No results  found for: PROLACTIN Lab Results  Component Value Date   CHOL 233 (H) 08/30/2016   TRIG 71 08/30/2016   HDL 133 08/30/2016   CHOLHDL 1.8 08/30/2016   VLDL 14 08/30/2016   LDLCALC 86 08/30/2016    Physical Findings: AIMS: Facial and Oral Movements Muscles of Facial Expression: None, normal Lips and Perioral Area: None, normal Jaw: None, normal Tongue: None, normal,Extremity Movements Upper (arms, wrists, hands, fingers): None, normal Lower (legs, knees, ankles, toes): None, normal, Trunk Movements Neck, shoulders, hips: None, normal, Overall Severity Severity of abnormal movements (highest score from questions above): None, normal Incapacitation due to abnormal movements: None, normal Patient's awareness of abnormal movements (rate only patient's report): No Awareness, Dental Status Current problems with teeth and/or dentures?: No Does patient usually wear dentures?: No  CIWA:  CIWA-Ar Total: 3 COWS:  COWS Total Score: 4  Musculoskeletal: Strength & Muscle Tone: within normal limits Gait & Station: normal Patient leans: N/A  Psychiatric Specialty Exam: Physical Exam  Review of Systems  Psychiatric/Behavioral: Positive for depression and substance abuse. Negative for hallucinations and suicidal ideas. The patient is nervous/anxious and has insomnia.   All other systems reviewed and are negative.   Blood pressure 129/89, pulse 84, temperature 97.3 F (36.3 C), temperature source Oral, resp. rate 16, height 5' 9"  (1.753 m), weight 77.6 kg (171 lb), SpO2 97 %.Body mass index is 25.25 kg/m.  General Appearance: Fairly Groomed  Eye Contact:  Good  Speech:  Normal Rate  Volume:  Decreased  Mood:  reports he is feeling better  Affect:  vaguely constricted, but does smile appropriately at times   Thought Process:  Linear  Orientation:  Full (Time, Place, and Person)  Thought Content:  Denies hallucinations, no delusions,not internally preoccupied   Suicidal Thoughts:  No  denies any suicidal or self injurious ideations, denies any homicidal or violent ideations  Homicidal Thoughts:  No  Memory:  Recent and remote grossly intact   Judgement:  Improving   Insight:  Improving   Psychomotor Activity:  Normal  Concentration:  Concentration: Good and Attention Span: Good  Recall:  Good  Fund of Knowledge:  Good  Language:  Good  Akathisia:  No  Handed:    AIMS (if indicated):     Assets:  Communication Skills Desire for Improvement Resilience Social Support  ADL's:  Intact  Cognition:  WNL  Sleep:  Number of Hours: 6.75   Assessment - patient reports some ongoing symptoms of WDL, but vitals are stable and does not appear to be in any acute distress . Tolerating detox protocol and Celexa trial well thus far. Denies suicidal ideations, and is future oriented, wanting to go to a rehab after discharge. Reports significant cravings for alcohol, and is expressing interest in Campral trial. Treatment Plan Summary: Alcohol use disorder, severe, dependence ([place]) Continue to encourage group and milieu participation to work on coping skills and symptom reduction Continue to encourage efforts to maintain sobriety and work on recovery Treatment team working on disposition planning options  Medications:   -continue Celexa 40m po daily for depression -continue Neurontin 3051mpo tid for anxiety  -continue ativan/CIWA protocol for alcohol withdrawal  -continue  Albuterol hfa inhaler 1-2 puffs q4h prn shob -continue Trazodone 5066mo qhs prn insomnia - start Campral 666 mgrs tid for alcohol cravings  Follow up on mildly elevated TSH- will check FT3, FT4     COBNeita GarnetD 08/31/2016, 5:38 PM   Patient ID: DavLadona Hornsale   DOB: [birth year] 25o.   MRN: [id number]

## 2016-08-31 NOTE — Progress Notes (Signed)
D.  Pt flat on approach, not feeling well, has a cold.  Pt did attend evening AA group, observed with appropriate interaction on the unit.  Pt denies SI/HI/hallucinations at this time.  A.  Support and encouragement offered, medication given as ordered for symptoms.  R.  Pt remains safe on the unit, will continue to monitor.

## 2016-08-31 NOTE — Plan of Care (Signed)
Problem: Activity: Goal: Sleeping patterns will improve Outcome: Progressing Pt slept 6.75 hours last night according to flowsheet.    

## 2016-08-31 NOTE — Progress Notes (Signed)
D: Pt was in the day room upon initial approach.  Pt presents with depressed affect and mood.  His goal is to "get some sleep."  Pt reports he [facility]  Pt denies SI/HI, denies hallucinations, reports withdrawal symptoms of tremors and anxiety.  Pt has been visible in milieu interacting with peers and staff appropriately.  Pt attended evening group.   A: Introduced self to pt.  Actively listened to pt and offered support and encouragement. Medications administered per order.  PRN medication administered for coughing.  RN applied bacitracin to wounds on pt's knuckles and applied dressings.   R: Pt is safe on the unit.  Pt is compliant with medications.  Pt verbally contracts for safety.  Will continue to monitor and assess.

## 2016-08-31 NOTE — Plan of Care (Signed)
Problem: Education: Goal: Knowledge of the prescribed therapeutic regimen will improve Outcome: Progressing Nurse discussed depression/coping skills with patient.        

## 2016-08-31 NOTE — Progress Notes (Signed)
Pt attended AA meeting this evening.  

## 2016-09-01 DIAGNOSIS — F17210 Nicotine dependence, cigarettes, uncomplicated: Secondary | ICD-10-CM

## 2016-09-01 DIAGNOSIS — F0630 Mood disorder due to known physiological condition, unspecified: Secondary | ICD-10-CM

## 2016-09-01 DIAGNOSIS — F1020 Alcohol dependence, uncomplicated: Secondary | ICD-10-CM

## 2016-09-01 DIAGNOSIS — Z79899 Other long term (current) drug therapy: Secondary | ICD-10-CM

## 2016-09-01 LAB — T4, FREE: FREE T4: 0.59 ng/dL — AB (ref 0.61–1.12)

## 2016-09-01 MED ORDER — HYDROXYZINE HCL 25 MG PO TABS
25.0000 mg | ORAL_TABLET | Freq: Four times a day (QID) | ORAL | Status: DC | PRN
  Administered 2016-09-01 – 2016-09-04 (×6): 25 mg via ORAL
  Filled 2016-09-01: qty 1
  Filled 2016-09-01: qty 12
  Filled 2016-09-01 (×4): qty 1
  Filled 2016-09-01: qty 12

## 2016-09-01 MED ORDER — LORAZEPAM 1 MG PO TABS
1.0000 mg | ORAL_TABLET | Freq: Four times a day (QID) | ORAL | Status: DC | PRN
  Administered 2016-09-02 – 2016-09-03 (×3): 1 mg via ORAL
  Filled 2016-09-01 (×3): qty 1

## 2016-09-01 MED ORDER — ONDANSETRON 4 MG PO TBDP: 4.0000 mg | ORAL_TABLET | Freq: Four times a day (QID) | ORAL | Status: DC | PRN

## 2016-09-01 NOTE — Plan of Care (Signed)
Problem: Activity: Goal: Interest or engagement in activities will improve Outcome: Progressing Pt has attended evening groups this weekend

## 2016-09-01 NOTE — Progress Notes (Signed)
D: Pt up and ambulating ad lib. Alert and cooperative. Presents in a calm and pleasant mood. Participated in groups and appears to have good insight into his alcohol addiction. Denied SI/HI/AVH/Pain. CIWA 11 this evening mostly from hand tremors and some anxiety with new orders noted from Np.  A: Pt has new orders for Ativan. Safety checks maintained.   R: Pt contracted for safety. No additional concerns noted.

## 2016-09-01 NOTE — [facility] Group Notes (Signed)
Nursing Psycho-educational Group:   Patient attended and actively participated in nursing group. Cooperative and engaged. 

## 2016-09-01 NOTE — Progress Notes (Signed)
D.  Pt pleasant on approach, complaint of continuing to overcome a cold.  Pt did attend evening wrap up group, observed interacting appropriately with peers on the unit.  Pt denies SI/HI/hallucinations at this time.  A.  Support and encouragement offered, medication given as ordered  R. Pt remains safe on the unit.  Will continue to monitor.

## 2016-09-01 NOTE — Progress Notes (Signed)
[facility] MD Progress Note  09/01/2016 11:22 AM JANE BROUGHTON  MRN:  [id number] Subjective:  Patient reports ongoing symptoms of WDL but states . Insight improved. Remains fatigue. Tolerating withdrawals  Objective:  I have discussed case with treatment team and have met with patient . Mood remains down. Somewhat better. Tolerating withdrawal treatment. Feels guilt and remorse related to drinking  He does state, as above, that he is starting to feel better today.  No disruptive or agitated behaviors on unit, but  Group/milieu participation has been limited, spending most time in his room. Denies medication side effects. He is future oriented, and wants to go to a rehab after discharge.  Principal Problem: Alcohol use disorder, severe, dependence ([place]) Diagnosis:   Patient Active Problem List   Diagnosis Date Noted  . Alcohol use disorder, severe, dependence ([place]) [F10.20] 08/29/2016   Total Time spent with patient: 20 minutes  Past Psychiatric History: see H&P  Past Medical History:  Past Medical History:  Diagnosis Date  . Alcohol abuse   . Bipolar 2 disorder ([place])   . Manic depression ([place])   . PTSD (post-traumatic stress disorder)     Past Surgical History:  Procedure Laterality Date  . APPENDECTOMY     Family History: History reviewed. No pertinent family history. Family Psychiatric  History: denies Social History:  History  Alcohol Use  . Yes    Comment: drinks 4- fifths of liquor daily     History  Drug Use  . Types: Marijuana    Social History   Social History  . Marital status: Single    Spouse name: N/A  . Number of children: N/A  . Years of education: N/A   Social History Main Topics  . Smoking status: Current Every Day Smoker    Packs/day: 1.50    Years: 7.00    Types: Cigarettes  . Smokeless tobacco: Never Used  . Alcohol use Yes     Comment: drinks 4- fifths of liquor daily  . Drug use:     Types: Marijuana  . Sexual activity: Not Currently    Other Topics Concern  . None   Social History Narrative  . None   Additional Social History:    Pain Medications: none Prescriptions: none Over the Counter: none  History of alcohol / drug use?: Yes Longest period of sobriety (when/how long): 9 1/2 months  Negative Consequences of Use: Financial, Legal Withdrawal Symptoms: Cramps, Fever / Chills, Sweats, Tremors Name of Substance 1: Alcohol  1 - Age of First Use: unknown 1 - Amount (size/oz): "I drink four 1/5ths per day" 1 - Frequency: daily 1 - Duration: last 4 months 1 - Last Use / Amount: 08/28/16 Name of Substance 2: Marijuana  2 - Age of First Use: 12 2 - Amount (size/oz): "1/2 joint" 2 - Frequency: daily  2 - Duration: ongoing  2 - Last Use / Amount: unknown   Sleep: fair  Appetite:  Improving   Current Medications: Current Facility-Administered Medications  Medication Dose Route Frequency Provider Last Rate Last Dose  . acamprosate (CAMPRAL) tablet 666 mg  666 mg Oral TID WC Jenne Campus, MD   666 mg at 09/01/16 0602  . acetaminophen (TYLENOL) tablet 650 mg  650 mg Oral Q6H PRN Encarnacion Slates, NP   650 mg at 08/30/16 1904  . albuterol (PROVENTIL HFA;VENTOLIN HFA) 108 (90 Base) MCG/ACT inhaler 1-2 puff  1-2 puff Inhalation Q4H PRN Benjamine Mola, FNP   2 puff  at 08/31/16 1816  . alum & mag hydroxide-simeth (MAALOX/MYLANTA) 200-200-20 MG/5ML suspension 30 mL  30 mL Oral Q4H PRN Encarnacion Slates, NP      . bacitracin ointment   Topical BID Jenne Campus, MD   [phone] application at 36/14/43 [phone]  . benzonatate (TESSALON) capsule 100 mg  100 mg Oral Q8H PRN Benjamine Mola, FNP   100 mg at 08/31/16 2105  . citalopram (CELEXA) tablet 20 mg  20 mg Oral Daily Norman Clay, MD   20 mg at 09/01/16 0818  . dextromethorphan (DELSYM) 30 MG/5ML liquid 30 mg  30 mg Oral BID PRN Jenne Campus, MD   30 mg at 09/01/16 0830  . gabapentin (NEURONTIN) capsule 300 mg  300 mg Oral TID Encarnacion Slates, NP   300 mg at 09/01/16 0825   . guaiFENesin (MUCINEX) 12 hr tablet 600 mg  600 mg Oral BID Benjamine Mola, FNP   600 mg at 09/01/16 0800  . hydrOXYzine (ATARAX/VISTARIL) tablet 25 mg  25 mg Oral Q6H PRN Encarnacion Slates, NP   25 mg at 08/31/16 2105  . loperamide (IMODIUM) capsule 2-4 mg  2-4 mg Oral PRN Encarnacion Slates, NP   4 mg at 08/30/16 1411  . LORazepam (ATIVAN) tablet 1 mg  1 mg Oral Q6H PRN Encarnacion Slates, NP      . magnesium hydroxide (MILK OF MAGNESIA) suspension 30 mL  30 mL Oral Daily PRN Encarnacion Slates, NP      . multivitamin with minerals tablet 1 tablet  1 tablet Oral Daily Encarnacion Slates, NP   1 tablet at 09/01/16 0867  . nicotine polacrilex (NICORETTE) gum 2 mg  2 mg Oral PRN Benjamine Mola, FNP   2 mg at 09/01/16 0831  . ondansetron (ZOFRAN-ODT) disintegrating tablet 4 mg  4 mg Oral Q6H PRN Encarnacion Slates, NP      . thiamine (B-1) injection 100 mg  100 mg Intramuscular Once Encarnacion Slates, NP      . thiamine (VITAMIN B-1) tablet 100 mg  100 mg Oral Daily Encarnacion Slates, NP   100 mg at 09/01/16 0826  . traZODone (DESYREL) tablet 50 mg  50 mg Oral QHS Encarnacion Slates, NP   50 mg at 08/31/16 2105    Lab Results:  Results for orders placed or performed during the hospital encounter of 08/29/16 (from the past 48 hour(s))  Hepatic function panel     Status: Abnormal   Collection Time: 08/31/16  6:08 AM  Result Value Ref Range   Total Protein 7.9 6.5 - 8.1 g/dL   Albumin 4.6 3.5 - 5.0 g/dL   AST 77 (H) 15 - 41 U/L   ALT 60 17 - 63 U/L   Alkaline Phosphatase 72 38 - 126 U/L   Total Bilirubin 0.6 0.3 - 1.2 mg/dL   Bilirubin, Direct 0.1 0.1 - 0.5 mg/dL   Indirect Bilirubin 0.5 0.3 - 0.9 mg/dL    Comment: Performed at [facility]  TSH     Status: Abnormal   Collection Time: 08/31/16  6:08 AM  Result Value Ref Range   TSH 5.560 (H) 0.350 - 4.500 uIU/mL    Comment: Performed by a 3rd Generation assay with a functional sensitivity of <=0.01 uIU/mL. Performed at [facility]      Blood Alcohol level:  Lab Results  Component Value Date   ETH 379 [facility]) 08/28/2016  ETH 59 (H) 70/17/7939    Metabolic Disorder Labs: Lab Results  Component Value Date   HGBA1C 5.0 08/30/2016   MPG 97 08/30/2016   No results found for: PROLACTIN Lab Results  Component Value Date   CHOL 233 (H) 08/30/2016   TRIG 71 08/30/2016   HDL 133 08/30/2016   CHOLHDL 1.8 08/30/2016   VLDL 14 08/30/2016   LDLCALC 86 08/30/2016    Physical Findings: AIMS: Facial and Oral Movements Muscles of Facial Expression: None, normal Lips and Perioral Area: None, normal Jaw: None, normal Tongue: None, normal,Extremity Movements Upper (arms, wrists, hands, fingers): None, normal Lower (legs, knees, ankles, toes): None, normal, Trunk Movements Neck, shoulders, hips: None, normal, Overall Severity Severity of abnormal movements (highest score from questions above): None, normal Incapacitation due to abnormal movements: None, normal Patient's awareness of abnormal movements (rate only patient's report): No Awareness, Dental Status Current problems with teeth and/or dentures?: No Does patient usually wear dentures?: No  CIWA:  CIWA-Ar Total: 2 COWS:  COWS Total Score: 4  Musculoskeletal: Strength & Muscle Tone: within normal limits Gait & Station: normal Patient leans: N/A  Psychiatric Specialty Exam: Physical Exam  Constitutional: He appears well-developed.  HENT:  Head: Normocephalic.    Review of Systems  Cardiovascular: Negative for chest pain.  Psychiatric/Behavioral: Positive for depression and substance abuse. Negative for hallucinations and suicidal ideas. The patient is nervous/anxious and has insomnia.   All other systems reviewed and are negative.   Blood pressure (!) 140/92, pulse 92, temperature 97.9 F (36.6 C), resp. rate 17, height 5' 9"  (1.753 m), weight 77.6 kg (171 lb), SpO2 98 %.Body mass index is 25.25 kg/m.  General Appearance: Fairly Groomed  Eye Contact:   Good  Speech:  Normal Rate  Volume:  Decreased  Mood:  Somewhat better  Affect:  vaguely constricted, but does smile appropriately at times   Thought Process:  Linear  Orientation:  Full (Time, Place, and Person)  Thought Content:  Denies hallucinations, no delusions,not internally preoccupied   Suicidal Thoughts:  No denies any suicidal or self injurious ideations, denies any homicidal or violent ideations  Homicidal Thoughts:  No  Memory:  Recent and remote grossly intact   Judgement:  Improving   Insight:  Improving   Psychomotor Activity:  Normal  Concentration:  Concentration: Good and Attention Span: Good  Recall:  Good  Fund of Knowledge:  Good  Language:  Good  Akathisia:  No  Handed:    AIMS (if indicated):     Assets:  Communication Skills Desire for Improvement Resilience Social Support  ADL's:  Intact  Cognition:  WNL  Sleep:  Number of Hours: 6.75   Assessment - patient reports some ongoing symptoms of WDL, but vitals are stable and does not appear to be in any acute distress . Tolerating detox protocol and Celexa trial well thus far. Denies suicidal ideations, and is future oriented, wanting to go to a rehab after discharge. Reports significant cravings for alcohol, and is expressing interest in Campral trial. Treatment Plan Summary: Alcohol use disorder, severe, dependence ([place]) Continue to encourage group and milieu participation to work on coping skills and symptom reduction Continue to encourage efforts to maintain sobriety and work on recovery Treatment team working on disposition planning options  Medications:  Continue current treatment plan and detox.  -continue Celexa 68m po daily for depression -continue Neurontin 305mpo tid for anxiety  -continue ativan/CIWA protocol for alcohol withdrawal  -continue  Albuterol hfa inhaler 1-2  puffs q4h prn shob -continue Trazodone 82m po qhs prn insomnia - continue Campral 666 mgrs tid for alcohol  cravings       ADe Nurse Velinda Wrobel, MD 09/01/2016, 11:22 AM

## 2016-09-01 NOTE — Progress Notes (Signed)
[facility] Group Notes:  (Nursing/MHT/Case Management/Adjunct)  Date:  09/01/2016  Time:  2030 Type of Therapy:  wrap up group  Participation Level:  Active  Participation Quality:  Appropriate, Attentive, Sharing and Supportive  Affect:  Appropriate  Cognitive:  Appropriate  Insight:  Improving  Engagement in Group:  Supportive  Modes of Intervention:  Clarification, Education and Support  Summary of Progress/Problems: [facility] [place] [facility] but may go to shorter term treatment first and then transition there. Pt stated it is either that or jail.  Michael Cordova, Michael Cordova 09/01/2016, 10:14 PM

## 2016-09-01 NOTE — [facility] Group Notes (Signed)
[facility] LCSW Group Therapy Note  09/01/2016 at 10 to 11 AM  Type of Therapy and Topic:  Group Therapy: Avoiding Self-Sabotaging and Enabling Behaviors  Participation Level:  Did Not Attend despite overhead announcement and CSW knocking on patient's door asking him to attend.   Summary of Progress/Problems:  The main focus of today's process group was for the patient to identify ways in which they have in the past sabotaged their own recovery or even sabotaged the holidays for themselves and perhaps others.   Michael Cordova C Harlean Regula, LCSW   

## 2016-09-01 NOTE — [facility] Group Notes (Signed)
[facility] LCSW Group Therapy Note  09/01/2016 at 10 to 11 AM  Type of Therapy and Topic:  Group Therapy: Avoiding Self-Sabotaging and Enabling Behaviors  Participation Level:  Did Not Attend despite overhead announcement and CSW knocking on patient's door asking him to attend.   Summary of Progress/Problems:  The main focus of today's process group was for the patient to identify ways in which they have in the past sabotaged their own recovery or even sabotaged the holidays for themselves and perhaps others.   Catherine C Harrill, LCSW   

## 2016-09-02 DIAGNOSIS — F0630 Mood disorder due to known physiological condition, unspecified: Secondary | ICD-10-CM

## 2016-09-02 LAB — T3, FREE: T3, Free: 2.5 pg/mL (ref 2.0–4.4)

## 2016-09-02 NOTE — Progress Notes (Signed)
Pt attended the evening AA speaker meeting. Pt was engaged and appropriate. Nat Lowenthal C, NT 09/02/16  10:07 PM  

## 2016-09-02 NOTE — Progress Notes (Signed)
[facility] MD Progress Note  09/02/2016 11:29 AM Michael Cordova  MRN:  [id number] Subjective:  Patient reports ongoing symptoms of WDL but improved mood and energy. Tolerating withdrawals treatment  Objective:  I have discussed case with treatment team and have met with patient . Mood remains down but somewhat better.  Feels guilt and remorse but insight is improving.    More alert no disruption.  Denies medication side effects. He is future oriented, and wants to go to a rehab after discharge.  Principal Problem: Alcohol use disorder, severe, dependence ([place]) Diagnosis:   Patient Active Problem List   Diagnosis Date Noted  . Alcohol use disorder, severe, dependence ([place]) [F10.20] 08/29/2016   Total Time spent with patient: 20 minutes  Past Psychiatric History: see H&P  Past Medical History:  Past Medical History:  Diagnosis Date  . Alcohol abuse   . Bipolar 2 disorder ([place])   . Manic depression ([place])   . PTSD (post-traumatic stress disorder)     Past Surgical History:  Procedure Laterality Date  . APPENDECTOMY     Family History: History reviewed. No pertinent family history. Family Psychiatric  History: denies Social History:  History  Alcohol Use  . Yes    Comment: drinks 4- fifths of liquor daily     History  Drug Use  . Types: Marijuana    Social History   Social History  . Marital status: Single    Spouse name: N/A  . Number of children: N/A  . Years of education: N/A   Social History Main Topics  . Smoking status: Current Every Day Smoker    Packs/day: 1.50    Years: 7.00    Types: Cigarettes  . Smokeless tobacco: Never Used  . Alcohol use Yes     Comment: drinks 4- fifths of liquor daily  . Drug use:     Types: Marijuana  . Sexual activity: Not Currently   Other Topics Concern  . None   Social History Narrative  . None   Additional Social History:    Pain Medications: none Prescriptions: none Over the Counter: none  History of alcohol  / drug use?: Yes Longest period of sobriety (when/how long): 9 1/2 months  Negative Consequences of Use: Financial, Legal Withdrawal Symptoms: Cramps, Fever / Chills, Sweats, Tremors Name of Substance 1: Alcohol  1 - Age of First Use: unknown 1 - Amount (size/oz): "I drink four 1/5ths per day" 1 - Frequency: daily 1 - Duration: last 4 months 1 - Last Use / Amount: 08/28/16 Name of Substance 2: Marijuana  2 - Age of First Use: 12 2 - Amount (size/oz): "1/2 joint" 2 - Frequency: daily  2 - Duration: ongoing  2 - Last Use / Amount: unknown   Sleep: fair  Appetite:  Improving   Current Medications: Current Facility-Administered Medications  Medication Dose Route Frequency Provider Last Rate Last Dose  . acamprosate (CAMPRAL) tablet 666 mg  666 mg Oral TID WC Myer Peer Cobos, MD   666 mg at 09/02/16 1050  . acetaminophen (TYLENOL) tablet 650 mg  650 mg Oral Q6H PRN Encarnacion Slates, NP   650 mg at 08/30/16 1904  . albuterol (PROVENTIL HFA;VENTOLIN HFA) 108 (90 Base) MCG/ACT inhaler 1-2 puff  1-2 puff Inhalation Q4H PRN Benjamine Mola, FNP   2 puff at 09/02/16 0859  . alum & mag hydroxide-simeth (MAALOX/MYLANTA) 200-200-20 MG/5ML suspension 30 mL  30 mL Oral Q4H PRN Encarnacion Slates, NP      .  bacitracin ointment   Topical BID Jenne Campus, MD   [phone] application at 15/17/61 1051  . benzonatate (TESSALON) capsule 100 mg  100 mg Oral Q8H PRN Benjamine Mola, FNP   100 mg at 09/02/16 0920  . citalopram (CELEXA) tablet 20 mg  20 mg Oral Daily Norman Clay, MD   20 mg at 09/02/16 0755  . dextromethorphan (DELSYM) 30 MG/5ML liquid 30 mg  30 mg Oral BID PRN Jenne Campus, MD   30 mg at 09/02/16 0756  . gabapentin (NEURONTIN) capsule 300 mg  300 mg Oral TID Encarnacion Slates, NP   300 mg at 09/02/16 1054  . hydrOXYzine (ATARAX/VISTARIL) tablet 25 mg  25 mg Oral Q6H PRN Benjamine Mola, FNP   25 mg at 09/01/16 2116  . LORazepam (ATIVAN) tablet 1 mg  1 mg Oral Q6H PRN Benjamine Mola, FNP   1 mg at  09/02/16 1050  . magnesium hydroxide (MILK OF MAGNESIA) suspension 30 mL  30 mL Oral Daily PRN Encarnacion Slates, NP      . multivitamin with minerals tablet 1 tablet  1 tablet Oral Daily Encarnacion Slates, NP   1 tablet at 09/02/16 0755  . nicotine polacrilex (NICORETTE) gum 2 mg  2 mg Oral PRN Benjamine Mola, FNP   2 mg at 09/02/16 1051  . ondansetron (ZOFRAN-ODT) disintegrating tablet 4 mg  4 mg Oral Q6H PRN Benjamine Mola, FNP      . thiamine (B-1) injection 100 mg  100 mg Intramuscular Once Encarnacion Slates, NP      . thiamine (VITAMIN B-1) tablet 100 mg  100 mg Oral Daily Encarnacion Slates, NP   100 mg at 09/02/16 0756  . traZODone (DESYREL) tablet 50 mg  50 mg Oral QHS Encarnacion Slates, NP   50 mg at 09/01/16 2116    Lab Results:  Results for orders placed or performed during the hospital encounter of 08/29/16 (from the past 48 hour(s))  T4, free     Status: Abnormal   Collection Time: 09/01/16  6:40 AM  Result Value Ref Range   Free T4 0.59 (L) 0.61 - 1.12 ng/dL    Comment: (NOTE) Biotin ingestion may interfere with free T4 tests. If the results are inconsistent with the TSH level, previous test results, or the clinical presentation, then consider biotin interference. If needed, order repeat testing after stopping biotin. Performed at [facility]   T3, free     Status: None   Collection Time: 09/01/16  6:40 AM  Result Value Ref Range   T3, Free 2.5 2.0 - 4.4 pg/mL    Comment: (NOTE) Performed At: [facility] [address] [place], [place] [id number] Lindon Romp MD [id number] Performed at [facility]     Blood Alcohol level:  Lab Results  Component Value Date   KKX 381 [facility]) 08/28/2016   ETH 59 (H) 82/99/3716    Metabolic Disorder Labs: Lab Results  Component Value Date   HGBA1C 5.0 08/30/2016   MPG 97 08/30/2016   No results found for: PROLACTIN Lab Results  Component Value Date   CHOL 233 (H) 08/30/2016   TRIG 71 08/30/2016   HDL  133 08/30/2016   CHOLHDL 1.8 08/30/2016   VLDL 14 08/30/2016   LDLCALC 86 08/30/2016    Physical Findings: AIMS: Facial and Oral Movements Muscles of Facial Expression: None, normal Lips and Perioral Area: None, normal Jaw: None, normal  Tongue: None, normal,Extremity Movements Upper (arms, wrists, hands, fingers): None, normal Lower (legs, knees, ankles, toes): None, normal, Trunk Movements Neck, shoulders, hips: None, normal, Overall Severity Severity of abnormal movements (highest score from questions above): None, normal Incapacitation due to abnormal movements: None, normal Patient's awareness of abnormal movements (rate only patient's report): No Awareness, Dental Status Current problems with teeth and/or dentures?: No Does patient usually wear dentures?: No  CIWA:  CIWA-Ar Total: 1 COWS:  COWS Total Score: 4  Musculoskeletal: Strength & Muscle Tone: within normal limits Gait & Station: normal Patient leans: N/A  Psychiatric Specialty Exam: Physical Exam  Constitutional: He is oriented to person, place, and time. He appears well-developed.  HENT:  Head: Normocephalic.  Neurological: He is alert and oriented to person, place, and time.    Review of Systems  Cardiovascular: Negative for chest pain.  Gastrointestinal: Negative for nausea.  Psychiatric/Behavioral: Positive for depression and substance abuse. Negative for hallucinations and suicidal ideas. The patient is nervous/anxious.   All other systems reviewed and are negative.   Blood pressure 127/84, pulse 82, temperature 98.6 F (37 C), resp. rate 16, height 5' 9" (1.753 m), weight 77.6 kg (171 lb), SpO2 98 %.Body mass index is 25.25 kg/m.  General Appearance: Fairly Groomed  Eye Contact:  Good  Speech:  Normal Rate  Volume:  Decreased  Mood:  improving  Affect:  vaguely constricted, but does smile appropriately at times   Thought Process:  Linear  Orientation:  Full (Time, Place, and Person)  Thought  Content:  Denies hallucinations, no delusions,not internally preoccupied   Suicidal Thoughts:  No denies any suicidal or self injurious ideations, denies any homicidal or violent ideations  Homicidal Thoughts:  No  Memory:  Recent and remote grossly intact   Judgement:  Improving   Insight:  Improving   Psychomotor Activity:  Normal  Concentration:  Concentration: Good and Attention Span: Good  Recall:  Good  Fund of Knowledge:  Good  Language:  Good  Akathisia:  No  Handed:    AIMS (if indicated):     Assets:  Communication Skills Desire for Improvement Resilience Social Support  ADL's:  Intact  Cognition:  WNL  Sleep:  Number of Hours: 5.5   Assessment - patient tolerating withdrawals treatment. Mood is improving Treatment Plan Summary: Alcohol use disorder, severe, dependence ([place]) Continue to encourage group and milieu participation to work on coping skills and symptom reduction Continue to encourage efforts to maintain sobriety and work on recovery Treatment team working on disposition planning options  Medications:  Continue current treatment plan.   -continue Celexa 68m po daily for depression -continue Neurontin 3091mpo tid for anxiety  -continue ativan/CIWA protocol for alcohol withdrawal  -continue  Albuterol hfa inhaler 1-2 puffs q4h prn shob -continue Trazodone 5072mo qhs prn insomnia - continue Campral 666 mgrs tid for alcohol cravings    AKHDe NurseADEEM, MD 09/02/2016, 11:29 AM

## 2016-09-02 NOTE — [facility] Group Notes (Signed)
Nurse Psycho-Educational Group centered on human needs and healthy support systems.  Patient attended the group and engaged actively.  

## 2016-09-02 NOTE — Progress Notes (Signed)
D   Pt is pleasant on approach after he gets his medications but does admit to increased irritability and anxiety until the medications kick in   He verbalizes he understands he will have some discomfort while detoxing  A    Verbal support given   Medications administered and effectiveness monitored    Q 15 min checks R    Pt safe at present time and receptive to verbal support

## 2016-09-02 NOTE — [facility] Group Notes (Signed)
  [facility] LCSW Group Therapy Note   09/02/2016  10 to 10:45 AM   Type of Therapy and Topic: Group Therapy: Feelings Around Returning Home & Establishing a Supportive Framework and Activity to Identify signs of Improvement or Decompensation   Participation Level: Active   Description of Group:  Patients first processed thoughts and feelings about up coming discharge. These included fears of upcoming changes, lack of change, new living environments, judgements and expectations from others and overall stigma of MH issues. We then discussed what is a supportive framework? What does it look like feel like and how do I discern it from and unhealthy non-supportive network? Learn how to cope when supports are not helpful and don't support you. Discuss what to do when your family/friends are not supportive.   Therapeutic Goals Addressed in Processing Group:  1. Patient will identify one healthy supportive network that they can use at discharge. 2. Patient will identify one factor of a supportive framework and how to tell it from an unhealthy network. 3. Patient able to identify one coping skill to use when they do not have positive supports from others. 4. Patient will demonstrate ability to communicate their needs through discussion and/or role plays.  Summary of Patient Progress:  Pt engaged easily during group session. As patients processed their anxiety about discharge and described healthy supports patient  Processed his anxiety about discharge placement. He was able to determine that instead of allowing constraints to negatively impact him he will process dealing with upcoming constraints as empowering.  Patient chose a visual to represent decompensation as incarceration and improvement as freedom and peace  Carney Bernatherine C Deren Degrazia, LCSW

## 2016-09-02 NOTE — Progress Notes (Signed)
Nursing Note: 0700-1900  D:  Pt presents with anxious mood and affect, becomes irritable at times. "My cravings are so strong right now,  I just want a gallon of Vodka."  Noted improvement in mood after prn meds given (Ativan and Vistaril). Rates depression as a 2/10 today.  Pt attending group activities and actively participating.  Goal for today, "Watching the football game."  A:  Encouraged to verbalize needs and concerns, active listening and support provided.  Continued Q 15 minute safety checks.    R:  Pt. denies A/V hallucinations and is able to verbally contract for safety.

## 2016-09-03 MED ORDER — LORATADINE 10 MG PO TABS
10.0000 mg | ORAL_TABLET | Freq: Every day | ORAL | Status: DC
  Administered 2016-09-03 – 2016-09-04 (×2): 10 mg via ORAL
  Filled 2016-09-03 (×5): qty 1

## 2016-09-03 MED ORDER — MAGNESIUM CITRATE PO SOLN: 1.0000 | Freq: Once | ORAL | Status: DC

## 2016-09-03 NOTE — Progress Notes (Signed)
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

## 2016-09-03 NOTE — Progress Notes (Signed)
Patient ID: Michael Cordova, male   DOB: 1981-06-04, 35 y.o.   MRN: [id number]  DAR: Pt. Denies SI/HI and A/V Hallucinations. He reports sleep is fair, appetite is good, energy level is normal, and concentration is poor. He rates depression 1/10, hopelessness 0/10, and anxiety 3/10. He reports withdrawal symptoms on his morning daily inventory sheet which includes irritability, cravings, and tremors. Patient does not report any pain but does report constipation. He received Milk of Magnesia with initially no relief. However, when writer offered a one time Magnesium citrate, patient reported he had seen results and therefore refused Mag. Citrate. Support and encouragement provided to the patient. Scheduled medications administered to patient per physician's orders. PRN medications administered throughout the day for nicotine craving, coughing, and anxiety. He reports relief upon reassessment. Patient is irritable upon first interaction but is more pleasant and cooperative as the day goes on. He is seen in the milieu more frequently this afternoon. Q15 minute checks are maintained for safety.

## 2016-09-03 NOTE — Progress Notes (Signed)
Pt attended the evening AA speaker meeting. Pt appeared engaged and was appropriate. Caswell Corwinwen, Keary Hanak C, NT 09/03/16 10:34 PM

## 2016-09-03 NOTE — Progress Notes (Signed)
[facility]  09/03/2016 12:11 PM Michael Cordova  MRN:  [id number]   Subjective:  Patient reports" I am feeling okay today, I am still constipated."  Objective:  Michael Cordova is awake, alert and oriented *3. Seen resting in bed.  Denies suicidal or homicidal ideation. Denies auditory or visual hallucination and does not appear to be responding to internal stimuli. Patient interacts well with staff and others. Patient reports he is medication compliant without mediation side effects. Patient denies depression or depressive symptoms. Patient report he is excited to start with a rehabilitation treatment program. Support, encouragement and reassurance was provided.   Principal Problem: Alcohol use disorder, severe, dependence (HCC) Diagnosis:   Patient Active Problem List   Diagnosis Date Noted  . 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 Alcohol use disorder, severe, dependence (HCC) [F10.20] 08/29/2016   Total Time spent with patient: 20 minutes  Past Psychiatric History: see H&P  Past Medical History:  Past Medical History:  Diagnosis Date  . Alcohol abuse   . Bipolar 2 disorder (HCC)   . Manic depression (HCC)   . PTSD (post-traumatic stress disorder)     Past Surgical History:  Procedure Laterality Date  . APPENDECTOMY     Family History: History reviewed. No pertinent family history. Family Psychiatric  History: denies Social History:  History  Alcohol Use  . Yes    Comment: drinks 4- fifths of liquor daily     History  Drug Use  . Types: Marijuana    Social History   Social History  . Marital status: Single    Spouse name: N/A  . Number of children: N/A  . Years of education: N/A   Social History Main Topics  . Smoking status: Current Every Day Smoker    Packs/day: 1.50    Years: 7.00    Types: Cigarettes  . Smokeless tobacco: Never Used  . Alcohol use Yes     Comment: drinks 4- fifths of liquor daily  . Drug use:     Types:  Marijuana  . Sexual activity: Not Currently   Other Topics Concern  . None   Social History Narrative  . None   Additional Social History:    Pain Medications: none Prescriptions: none Over the Counter: none  History of alcohol / drug use?: Yes Longest period of sobriety (when/how long): 9 1/2 months  Negative Consequences of Use: Financial, Legal Withdrawal Symptoms: Cramps, Fever / Chills, Sweats, Tremors Name of Substance 1: Alcohol  1 - Age of First Use: unknown 1 - Amount (size/oz): "I drink four 1/5ths per day" 1 - Frequency: daily 1 - Duration: last 4 months 1 - Last Use / Amount: 08/28/16 Name of Substance 2: Marijuana  2 - Age of First Use: 12 2 - Amount (size/oz): "1/2 joint" 2 - Frequency: daily  2 - Duration: ongoing  2 - Last Use / Amount: unknown   Sleep: fair  Appetite:  Improving   Current Medications: Current Facility-Administered Medications  Medication Dose Route Frequency Provider Last Rate Last Dose  . acamprosate (CAMPRAL) tablet 666 mg  666 mg Oral TID WC Craige CottaFernando A Cassara Nida, MD   666 mg at 09/03/16 1145  . acetaminophen (TYLENOL) tablet 650 mg  650 mg Oral Q6H PRN Sanjuana KavaAgnes I Nwoko, NP   650 mg at 08/30/16 1904  . albuterol (PROVENTIL HFA;VENTOLIN HFA) 108 (90 Base) MCG/ACT inhaler 1-2 puff  1-2 puff Inhalation Q4H PRN Beau FannyJohn C Withrow, FNP  2 puff at 09/02/16 0859  . alum & mag hydroxide-simeth (MAALOX/MYLANTA) 200-200-20 MG/5ML suspension 30 mL  30 mL Oral Q4H PRN Sanjuana Kava, NP      . bacitracin ointment   Topical BID Craige Cotta, MD   [phone] application at 09/03/16 [phone]  . benzonatate (TESSALON) capsule 100 mg  100 mg Oral Q8H PRN Beau Fanny, FNP   100 mg at 09/03/16 1145  . citalopram (CELEXA) tablet 20 mg  20 mg Oral Daily Neysa Hotter, MD   20 mg at 09/03/16 0748  . dextromethorphan (DELSYM) 30 MG/5ML liquid 30 mg  30 mg Oral BID PRN Craige Cotta, MD   30 mg at 09/03/16 0748  . gabapentin (NEURONTIN) capsule 300 mg  300 mg Oral TID  Sanjuana Kava, NP   300 mg at 09/03/16 1145  . hydrOXYzine (ATARAX/VISTARIL) tablet 25 mg  25 mg Oral Q6H PRN Beau Fanny, FNP   25 mg at 09/03/16 0750  . LORazepam (ATIVAN) tablet 1 mg  1 mg Oral Q6H PRN Beau Fanny, FNP   1 mg at 09/02/16 1953  . magnesium hydroxide (MILK OF MAGNESIA) suspension 30 mL  30 mL Oral Daily PRN Sanjuana Kava, NP   30 mL at 09/03/16 0747  . multivitamin with minerals tablet 1 tablet  1 tablet Oral Daily Sanjuana Kava, NP   1 tablet at 09/03/16 0749  . nicotine polacrilex (NICORETTE) gum 2 mg  2 mg Oral PRN Beau Fanny, FNP   2 mg at 09/03/16 1145  . ondansetron (ZOFRAN-ODT) disintegrating tablet 4 mg  4 mg Oral Q6H PRN Beau Fanny, FNP      . thiamine (B-1) injection 100 mg  100 mg Intramuscular Once Sanjuana Kava, NP      . thiamine (VITAMIN B-1) tablet 100 mg  100 mg Oral Daily Sanjuana Kava, NP   100 mg at 09/03/16 0749  . traZODone (DESYREL) tablet 50 mg  50 mg Oral QHS Sanjuana Kava, NP   50 mg at 09/02/16 2109    Lab Results:  No results found for this or any previous visit (from the past 48 hour(s)).  Blood Alcohol level:  Lab Results  Component Value Date   ETH 379 (HH) 08/28/2016   ETH 59 (H) 08/27/2016    Metabolic Disorder Labs: Lab Results  Component Value Date   HGBA1C 5.0 08/30/2016   MPG 97 08/30/2016   No results found for: PROLACTIN Lab Results  Component Value Date   CHOL 233 (H) 08/30/2016   TRIG 71 08/30/2016   HDL 133 08/30/2016   CHOLHDL 1.8 08/30/2016   VLDL 14 08/30/2016   LDLCALC 86 08/30/2016    Physical Findings: AIMS: Facial and Oral Movements Muscles of Facial Expression: None, normal Lips and Perioral Area: None, normal Jaw: None, normal Tongue: None, normal,Extremity Movements Upper (arms, wrists, hands, fingers): None, normal Lower (legs, knees, ankles, toes): None, normal, Trunk Movements Neck, shoulders, hips: None, normal, Overall Severity Severity of abnormal movements (highest score from  questions above): None, normal Incapacitation due to abnormal movements: None, normal Patient's awareness of abnormal movements (rate only patient's report): No Awareness, Dental Status Current problems with teeth and/or dentures?: No Does patient usually wear dentures?: No  CIWA:  CIWA-Ar Total: 1 COWS:  COWS Total Score: 4  Musculoskeletal: Strength & Muscle Tone: within normal limits Gait & Station: normal Patient leans: N/A  Psychiatric Specialty Exam: Physical Exam  Vitals  reviewed. Constitutional: He is oriented to person, place, and time. He appears well-developed.  HENT:  Head: Normocephalic.  Neurological: He is alert and oriented to person, place, and time.  Psychiatric: He has a normal mood and affect. His behavior is normal.    Review of Systems  Gastrointestinal: Positive for constipation.  Psychiatric/Behavioral: Positive for depression and substance abuse. Negative for hallucinations and suicidal ideas. The patient is nervous/anxious.   All other systems reviewed and are negative.   Blood pressure 120/76, pulse 87, temperature 97.9 F (36.6 C), temperature source Oral, resp. rate 18, height 5\' 9"  (1.753 m), weight 77.6 kg (171 lb), SpO2 98 %.Body mass index is 25.25 kg/m.  General Appearance: Casual  Eye Contact:  Fair  Speech:  Normal Rate  Volume:  Normal  Mood:  improving  Affect:  vaguely constricted, but does smile appropriately at times   Thought Process:  Linear  Orientation:  Full (Time, Place, and Person)  Thought Content:  Denies hallucinations, no delusions,not internally preoccupied   Suicidal Thoughts:  No   Homicidal Thoughts:  No  Memory:  Recent and remote grossly intact   Judgement:  Improving   Insight:  Improving   Psychomotor Activity:  Normal  Concentration:  Concentration: Good and Attention Span: Good  Recall:  Good  Fund of Knowledge:  Good  Language:  Good  Akathisia:  No  Handed:    AIMS (if indicated):     Assets:   Communication Skills Desire for Improvement Resilience Social Support  ADL's:  Intact  Cognition:  WNL  Sleep:  Number of Hours: 6.75     I agree with current treatment plan on 09/03/2016, Patient seen face-to-face for psychiatric evaluation follow-up, chart reviewed. Reviewed the information documented and agree with the treatment plan.  Treatment Plan Summary: Daily contact with patient to assess and evaluate symptoms and progress in treatment and Medication management  -continue Celexa 20mg  po daily for depression -continue Neurontin 300mg  po tid for anxiety  -continue ativan/CIWA protocol for alcohol withdrawal  -continue  Albuterol hfa inhaler 1-2 puffs q4h prn shob -continue Trazodone 50mg  po qhs prn insomnia - continue Campral 666 mgrs tid for alcohol cravings Will continue to monitor vitals ,medication compliance and treatment side effects while patient is here.   CSW will start working on disposition.  Patient to participate in therapeutic milieu.   Oneta Rackanika N Lewis, NP 09/03/2016, 12:11 PM   Agree with NP Progress Note as above

## 2016-09-04 DIAGNOSIS — T148XXA Other injury of unspecified body region, initial encounter: Secondary | ICD-10-CM

## 2016-09-04 DIAGNOSIS — T23029A Burn of unspecified degree of unspecified single finger (nail) except thumb, initial encounter: Secondary | ICD-10-CM

## 2016-09-04 MED ORDER — CITALOPRAM HYDROBROMIDE 20 MG PO TABS: 20.0000 mg | ORAL_TABLET | Freq: Every day | ORAL | 0 refills | Status: DC

## 2016-09-04 MED ORDER — TRAZODONE HCL 50 MG PO TABS: 50.0000 mg | ORAL_TABLET | Freq: Every day | ORAL | 0 refills | Status: DC

## 2016-09-04 MED ORDER — ACAMPROSATE CALCIUM 333 MG PO TBEC: 666.0000 mg | DELAYED_RELEASE_TABLET | Freq: Three times a day (TID) | ORAL | 0 refills | Status: DC

## 2016-09-04 MED ORDER — THIAMINE HCL 100 MG PO TABS: 100.0000 mg | ORAL_TABLET | Freq: Every day | ORAL | 0 refills | Status: DC

## 2016-09-04 MED ORDER — GABAPENTIN 300 MG PO CAPS: 300.0000 mg | ORAL_CAPSULE | Freq: Three times a day (TID) | ORAL | 0 refills | Status: DC

## 2016-09-04 MED ORDER — HYDROXYZINE HCL 25 MG PO TABS: 25.0000 mg | ORAL_TABLET | Freq: Four times a day (QID) | ORAL | 0 refills | Status: DC | PRN

## 2016-09-04 MED ORDER — NICOTINE POLACRILEX 2 MG MT GUM: 2.0000 mg | CHEWING_GUM | OROMUCOSAL | 0 refills | Status: DC | PRN

## 2016-09-04 MED ORDER — ALBUTEROL SULFATE HFA 108 (90 BASE) MCG/ACT IN AERS: 1.0000 | INHALATION_SPRAY | RESPIRATORY_TRACT | 0 refills | Status: DC | PRN

## 2016-09-04 NOTE — Progress Notes (Signed)
  [facility] Discharge Plan :  Will you be returning to the same living situation after discharge:  No. Pt is planning to stay with his sponsor today.  At discharge, do you have transportation home?: Yes,  bus pass and PART money in chart. Do you have the ability to pay for your medications: Yes,  mental health  Release of information consent forms completed and submitted to medical records by CSW.   Patient to Follow up at: Follow-up Information    [facility] Follow up on 09/05/2016.   Why:  Screening assessment for possible admission on Weds. 12/27 at 7:45am. Please bring photo ID, social security card, clothing, and 30 days of medications. Call if you need to reschedule.  Contact information: [address] [place] [address] [phone]        [facility] [facility] Follow up.   Why:  Office is closed to day. Please walk in within 48 hours of discharge for hospital follow-up/medication management/assessment for counseling services.  Contact information: [address] [place], [place] [address] Phone: [phone] Fax: [phone]          Next level of care provider has access to [facility] Link:no  Safety Planning and Suicide Prevention discussed: Yes,  SPE completed with pt's friend. SPI pamphlet and Mobile Crisis information provided to pt;   Have you used any form of tobacco in the last 30 days? (Cigarettes, Smokeless Tobacco, Cigars, and/or Pipes): Yes  Has patient been referred to the Quitline?: Patient refused referral  Patient has been referred for addiction treatment: Yes  Khy Pitre N Smart LCSW 09/04/2016, 10:00 AM

## 2016-09-04 NOTE — Discharge Summary (Signed)
Physician Discharge Summary Note  Patient:  Michael Cordova is an 35 y.o., male MRN:  [id number] DOB:  07-12-1981 Patient phone:  [phone] (home)  [place] [address],  Total Time spent with patient: 45 minutes  Date of Admission:  08/29/2016 Date of Discharge: 09/04/16  Reason for Admission:   35yo male with history of bipolar, PTSD, etoh abuse and withdrawal, smoking, presents with concern for cough, nasal congestion, suicidal ideation with plan to "drink myself to death." Per ED note, patient presents after a seizure witness by a friend. No further details available.   Patient states that he has been depressed since out of prison a few days ago for DUI after 44 days. He is homeless and felt helpless, being by himself. He "drank self to death"; drank a 4 fifth of liquor. He drinks that amount every day as well. He reports that the longest sobriety was for 9.5 months. He finds AA meeting and going to church to be helpful. He relapsed in 04/2016 due to "woman." Patient is motivated for sobriety and is interested in residential treatment. He denies history of seizure but complains of tremors.   He reports feeling depressed. He denies SI. He reports anxiety and panic attacks. He reports history of euphoria, spending $1000 for clothes, shoes, last in August when he was sober. It lasts 3-7 days. He denies decreased need for sleep. He reports worsening nightmares when he drinks and reports occasional flashback. He also reports cough and pain on his hands with some pus.   Principal Problem: Alcohol use disorder, severe, dependence [facility](HCC) Discharge Diagnoses: Patient Active Problem List   Diagnosis Date Noted  . 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 Alcohol use disorder, severe, dependence (HCC) [F10.20] 08/29/2016    Past Psychiatric History: see H&P  Past Medical History:  Past Medical History:  Diagnosis Date  . Alcohol abuse   .  Bipolar 2 disorder (HCC)   . Manic depression (HCC)   . PTSD (post-traumatic stress disorder)     Past Surgical History:  Procedure Laterality Date  . APPENDECTOMY     Family History: History reviewed. No pertinent family history. Family Psychiatric  History: see H&P Social History:  History  Alcohol Use  . Yes    Comment: drinks 4- fifths of liquor daily     History  Drug Use  . Types: Marijuana    Social History   Social History  . Marital status: Single    Spouse name: N/A  . Number of children: N/A  . Years of education: N/A   Social History Main Topics  . Smoking status: Current Every Day Smoker    Packs/day: 1.50    Years: 7.00    Types: Cigarettes  . Smokeless tobacco: Never Used  . Alcohol use Yes     Comment: drinks 4- fifths of liquor daily  . Drug use:     Types: Marijuana  . Sexual activity: Not Currently   Other Topics Concern  . None   Social History Narrative  . None    Hospital Course:   Michael Cordova was admitted for Alcohol use disorder, severe, dependence (HCC) , and crisis management.  Pt was treated discharged with the medications listed below under Medication List.  Medical problems were identified and treated as needed.  Home medications were restarted as appropriate.  Improvement was monitored by observation and Michael Cordova 's daily report of symptom reduction.  Emotional  and mental status was monitored by daily self-inventory reports completed by Michael Cordova and clinical staff.         Michael Cordova was evaluated by the treatment team for stability and plans for continued recovery upon discharge. Michael Cordova 's motivation was an integral factor for scheduling further treatment. Employment, transportation, bed availability, health status, family support, and any pending legal issues were also considered during hospital stay. Pt was offered further treatment options upon discharge including but not limited to Residential, Intensive  Outpatient, and Outpatient treatment.  Michael Cordova will follow up with the services as listed below under Follow Up Information.     Upon completion of this admission the patient was both mentally and medically stable for discharge denying suicidal/homicidal ideation, auditory/visual/tactile hallucinations, delusional thoughts and paranoia.    Michael Cordova responded well to treatment with campral, albuterol, celexa, neurontin, vistaril, nicotine, thiamine, trazodone without adverse effects. Pt demonstrated improvement without reported or observed adverse effects to the point of stability appropriate for outpatient management. Pertinent labs include: BAL 379, AST 77, TSH 5.56 for which outpatient follow-up is necessary for lab recheck as mentioned below. Reviewed CBC, CMP, BAL, and UDS; all unremarkable aside from noted exceptions.    Physical Findings: AIMS: Facial and Oral Movements Muscles of Facial Expression: None, normal Lips and Perioral Area: None, normal Jaw: None, normal Tongue: None, normal,Extremity Movements Upper (arms, wrists, hands, fingers): None, normal Lower (legs, knees, ankles, toes): None, normal, Trunk Movements Neck, shoulders, hips: None, normal, Overall Severity Severity of abnormal movements (highest score from questions above): None, normal Incapacitation due to abnormal movements: None, normal Patient's awareness of abnormal movements (rate only patient's report): No Awareness, Dental Status Current problems with teeth and/or dentures?: No Does patient usually wear dentures?: No  CIWA:  CIWA-Ar Total: 1 COWS:  COWS Total Score: 4  Musculoskeletal: Strength & Muscle Tone: within normal limits Gait & Station: normal Patient leans: N/A  Psychiatric Specialty Exam: Physical Exam  ROS  Blood pressure 110/80, pulse 77, temperature 97.8 F (36.6 C), temperature source Oral, resp. rate 18, height 5\' 9"  (1.753 m), weight 77.6 kg (171 lb), SpO2 98 %.Body mass  index is 25.25 kg/m.  SEE MD PSE IN SRA  Have you used any form of tobacco in the last 30 days? (Cigarettes, Smokeless Tobacco, Cigars, and/or Pipes): Yes  Has this patient used any form of tobacco in the last 30 days? (Cigarettes, Smokeless Tobacco, Cigars, and/or Pipes) Yes, Yes, A prescription for an FDA-approved tobacco cessation medication was offered at discharge and the patient refused  Blood Alcohol level:  Lab Results  Component Value Date   ETH 379 (HH) 08/28/2016   ETH 59 (H) 08/27/2016    Metabolic Disorder Labs:  Lab Results  Component Value Date   HGBA1C 5.0 08/30/2016   MPG 97 08/30/2016   No results found for: PROLACTIN Lab Results  Component Value Date   CHOL 233 (H) 08/30/2016   TRIG 71 08/30/2016   HDL 133 08/30/2016   CHOLHDL 1.8 08/30/2016   VLDL 14 08/30/2016   LDLCALC 86 08/30/2016    See Psychiatric Specialty Exam and Suicide Risk Assessment completed by Attending Physician prior to discharge.  Discharge destination:  Home  Is patient on multiple antipsychotic therapies at discharge:  No   Has Patient had three or more failed trials of antipsychotic monotherapy by history:  No  Recommended Plan for Multiple Antipsychotic Therapies: NA   Allergies as of 09/04/2016  Reactions   Penicillins Other (See Comments)   "passed out"      Medication List    STOP taking these medications   chlordiazePOXIDE 25 MG capsule Commonly known as:  LIBRIUM   SEROQUEL XR 150 MG 24 hr tablet Generic drug:  QUEtiapine Fumarate     TAKE these medications     Indication  acamprosate 333 MG tablet Commonly known as:  CAMPRAL Take 2 tablets (666 mg total) by mouth 3 (three) times daily with meals.  Indication:  Excessive Use of Alcohol   albuterol 108 (90 Base) MCG/ACT inhaler Commonly known as:  PROVENTIL HFA;VENTOLIN HFA Inhale 1-2 puffs into the lungs every 4 (four) hours as needed for wheezing or shortness of breath.  Indication:  shortness of  breath   citalopram 20 MG tablet Commonly known as:  CELEXA Take 1 tablet (20 mg total) by mouth daily. Start taking on:  09/05/2016  Indication:  Depression   gabapentin 300 MG capsule Commonly known as:  NEURONTIN Take 1 capsule (300 mg total) by mouth 3 (three) times daily.  Indication:  mood stabilization   hydrOXYzine 25 MG tablet Commonly known as:  ATARAX/VISTARIL Take 1 tablet (25 mg total) by mouth every 6 (six) hours as needed for anxiety.  Indication:  Anxiety Neurosis   nicotine polacrilex 2 MG gum Commonly known as:  NICORETTE Take 1 each (2 mg total) by mouth as needed for smoking cessation.  Indication:  Nicotine Addiction   thiamine 100 MG tablet Take 1 tablet (100 mg total) by mouth daily. Start taking on:  09/05/2016  Indication:  Deficiency in Thiamine or Vitamin B1   traZODone 50 MG tablet Commonly known as:  DESYREL Take 1 tablet (50 mg total) by mouth at bedtime.  Indication:  Trouble Sleeping      Follow-up Information    [facility] Follow up on 09/05/2016.   Why:  Screening assessment for possible admission on Weds. 12/27 at 7:45am. Please bring photo ID, social security card, clothing, and 30 days of medications. Call if you need to reschedule.  Contact information: [address] [place] [address] [phone]        [facility]) Follow up.   Why:  Office is closed to day. Please walk in within 48 hours of discharge for hospital follow-up/medication management/assessment for counseling services.  Contact information: [address] [place], [place] [address] Phone: [phone] Fax: [phone]          Follow-up recommendations:  Activity:  As tolerated Diet:  Heart healthy with low sodium.  Comments:   Take all medications as prescribed. Keep all follow-up appointments as scheduled.  Do not consume alcohol or use illegal drugs while on prescription medications. Report  any adverse effects from your medications to your primary care provider promptly.  In the event of recurrent symptoms or worsening symptoms, call 911, a crisis hotline, or go to the nearest emergency department for evaluation.    Signed: Beau Fanny, FNP 09/04/2016, 10:36 AM

## 2016-09-04 NOTE — [facility] Suicide Risk Assessment (Signed)
[facility]   Principal Problem: Alcohol use disorder, severe, dependence (HCC) Discharge Diagnoses:  Patient Active Problem List   Diagnosis Date Noted  . 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 Alcohol use disorder, severe, dependence (HCC) [F10.20] 08/29/2016    Total Time spent with patient: 20 minutes  Musculoskeletal: Strength & Muscle Tone: within normal limits Gait & Station: normal Patient leans: N/A  Psychiatric Specialty Exam: Review of Systems  Psychiatric/Behavioral: Positive for substance abuse.  All other systems reviewed and are negative.   Blood pressure 110/80, pulse 77, temperature 97.8 F (36.6 C), temperature source Oral, resp. rate 18, height 5\' 9"  (1.753 m), weight 171 lb (77.6 kg), SpO2 98 %.Body mass index is 25.25 kg/m.  General Appearance: Casual  Eye Contact::  Good  Speech:  Clear and Coherent409  Volume:  Normal  Mood:  "good"  Affect:  Constricted- improving  Thought Process:  Coherent and Goal Directed  Orientation:  Full (Time, Place, and Person)  Thought Content:  Logical Perceptions: denies AH/VH  Suicidal Thoughts:  No  Homicidal Thoughts:  No  Memory:  Immediate;   Good Recent;   Good Remote;   Good  Judgement:  Good  Insight:  Good  Psychomotor Activity:  Normal  Concentration:  Good  Recall:  Good  Fund of Knowledge:Good  Language: Good  Akathisia:  No  Handed:  Right  AIMS (if indicated):     Assets:  Communication Skills Desire for Improvement  Sleep:  Number of Hours: 6.5  Cognition: WNL  ADL's:  Intact   Mental Status Per Nursing Assessment::   On Admission:  NA  Demographic Factors:  Male and Unemployed  Loss Factors: NA  Historical Factors: Family history of mental illness or substance abuse Sister- bipolar, mother- bipolar, brother- ADHD,   Risk Reduction Factors:   Positive social support and Positive therapeutic relationship  Continued Clinical  Symptoms:  Alcohol/Substance Abuse/Dependencies  Cognitive Features That Contribute To Risk:  None    Suicide Risk:  Mild:  Suicidal ideation of limited frequency, intensity, duration, and specificity.  There are no identifiable plans, no associated intent, mild dysphoria and related symptoms, good self-control (both objective and subjective assessment), few other risk factors, and identifiable protective factors, including available and accessible social support.  Follow-up Information    [facility] Follow up on 09/05/2016.   Why:  Screening assessment for possible admission on Weds. 12/27 at 7:45am. Please bring photo ID, social security card, clothing, and 30 days of medications. Call if you need to reschedule.  Contact information: [place] [place] [address] [phone]        [facility] [facility].   Why:  Office is closed to day. Please walk in within 48 hours of discharge for hospital follow-up/medication management/assessment for counseling services.  Contact information: [address] [place], [place] [address] Phone: [phone] Fax: [phone]         Patient will be discharged to his sponsor's house. He is motivated for sobriety, and agrees for follow up appointment as above. He denies any SI, stating that he would feel good as long as he stays sobriety. He is future oriented, and is safe to be discharged.  Plan Of Care/Follow-up recommendations:  Activity:  regular Diet:  regular Tests:  n/a Other:  n/a  Neysa Hottereina Sondra Blixt, MD 09/04/2016, 10:00 AM

## 2016-09-04 NOTE — Progress Notes (Signed)
Patient discharged per physician order; patient denies SI/HI and A/V hallucinations; patient received prescriptions, samples, AVS, copy of the suicide safety plan, suicide risk assessment note, and transition record given to the patient after it was reviewed;patient also received bus pass and money for the [facility] bus; patient had no other questions or concerns at this time; patient verbalized and signed that all belongings were returned; patient left the unit ambulatory

## 2016-09-04 NOTE — Tx Team (Signed)
Interdisciplinary Treatment and Diagnostic Plan Update  09/04/2016 Time of Session: 9:30am Michael Cordova MRN: [id number]  Principal Diagnosis: Alcohol use disorder, severe, dependence ([place])   Current Medications:  Current Facility-Administered Medications  Medication Dose Route Frequency Provider Last Rate Last Dose  . acamprosate (CAMPRAL) tablet 666 mg  666 mg Oral TID WC Jenne Campus, MD   666 mg at 09/04/16 0617  . acetaminophen (TYLENOL) tablet 650 mg  650 mg Oral Q6H PRN Encarnacion Slates, NP   650 mg at 08/30/16 1904  . albuterol (PROVENTIL HFA;VENTOLIN HFA) 108 (90 Base) MCG/ACT inhaler 1-2 puff  1-2 puff Inhalation Q4H PRN Benjamine Mola, FNP   2 puff at 09/02/16 0859  . alum & mag hydroxide-simeth (MAALOX/MYLANTA) 200-200-20 MG/5ML suspension 30 mL  30 mL Oral Q4H PRN Encarnacion Slates, NP      . bacitracin ointment   Topical BID Jenne Campus, MD      . benzonatate (TESSALON) capsule 100 mg  100 mg Oral Q8H PRN Benjamine Mola, FNP   100 mg at 09/03/16 1145  . citalopram (CELEXA) tablet 20 mg  20 mg Oral Daily Norman Clay, MD   20 mg at 09/04/16 0759  . dextromethorphan (DELSYM) 30 MG/5ML liquid 30 mg  30 mg Oral BID PRN Jenne Campus, MD   30 mg at 09/03/16 0748  . gabapentin (NEURONTIN) capsule 300 mg  300 mg Oral TID Encarnacion Slates, NP   300 mg at 09/04/16 0800  . hydrOXYzine (ATARAX/VISTARIL) tablet 25 mg  25 mg Oral Q6H PRN Benjamine Mola, FNP   25 mg at 09/03/16 2104  . loratadine (CLARITIN) tablet 10 mg  10 mg Oral Daily Derrill Center, NP   10 mg at 09/04/16 0803  . LORazepam (ATIVAN) tablet 1 mg  1 mg Oral Q6H PRN Benjamine Mola, FNP   1 mg at 09/03/16 2104  . magnesium citrate solution 1 Bottle  1 Bottle Oral Once Derrill Center, NP      . magnesium hydroxide (MILK OF MAGNESIA) suspension 30 mL  30 mL Oral Daily PRN Encarnacion Slates, NP   30 mL at 09/03/16 0747  . multivitamin with minerals tablet 1 tablet  1 tablet Oral Daily Encarnacion Slates, NP   1 tablet at 09/04/16 0759   . nicotine polacrilex (NICORETTE) gum 2 mg  2 mg Oral PRN Benjamine Mola, FNP   2 mg at 09/04/16 0800  . ondansetron (ZOFRAN-ODT) disintegrating tablet 4 mg  4 mg Oral Q6H PRN Benjamine Mola, FNP      . thiamine (B-1) injection 100 mg  100 mg Intramuscular Once Encarnacion Slates, NP      . thiamine (VITAMIN B-1) tablet 100 mg  100 mg Oral Daily Encarnacion Slates, NP   100 mg at 09/04/16 0759  . traZODone (DESYREL) tablet 50 mg  50 mg Oral QHS Encarnacion Slates, NP   50 mg at 09/03/16 2104   PTA Medications: Prescriptions Prior to Admission  Medication Sig Dispense Refill Last Dose  . chlordiazePOXIDE (LIBRIUM) 25 MG capsule 53m PO TID x 1D, then 25-519mPO BID X 1D, then 25-5027mO QD X 1D (Patient not taking: Reported on 08/29/2016) 10 capsule 0 Not Taking at Unknown time  . gabapentin (NEURONTIN) 300 MG capsule Take 300 mg by mouth 3 (three) times daily.   Not Taking at Unknown time  . QUEtiapine Fumarate (SEROQUEL XR) 150 MG 24  hr tablet Take 150 mg by mouth at bedtime.   Not Taking at Unknown time  . traZODone (DESYREL) 50 MG tablet Take 50 mg by mouth at bedtime.   Not Taking at Unknown time    Patient Stressors: Financial difficulties Legal issue Medication change or noncompliance Occupational concerns Substance abuse  Patient Strengths: Capable of independent living General fund of knowledge Motivation for treatment/growth Physical Health  Treatment Modalities: Medication Management, Group therapy, Case management,  1 to 1 session with clinician, Psychoeducation, Recreational therapy.   Physician Treatment Plan for Primary Diagnosis: Alcohol use disorder, severe, dependence ([place]) Long Term Goal(s): Improvement in symptoms so as ready for discharge Improvement in symptoms so as ready for discharge   Short Term Goals: Ability to demonstrate self-control will improve Ability to identify and develop effective coping behaviors will improve Ability to demonstrate self-control will  improve Ability to identify and develop effective coping behaviors will improve  Medication Management: Evaluate patient's response, side effects, and tolerance of medication regimen.  Therapeutic Interventions: 1 to 1 sessions, Unit Group sessions and Medication administration.  Evaluation of Outcomes: Met    RN Treatment Plan for Primary Diagnosis: Alcohol use disorder, severe, dependence ([place]) Long Term Goal(s): Knowledge of disease and therapeutic regimen to maintain health will improve  Short Term Goals: Ability to remain free from injury will improve, Ability to disclose and discuss suicidal ideas, Ability to identify and develop effective coping behaviors will improve and Compliance with prescribed medications will improve  Medication Management: RN will administer medications as ordered by provider, will assess and evaluate patient's response and provide education to patient for prescribed medication. RN will report any adverse and/or side effects to prescribing provider.  Therapeutic Interventions: 1 on 1 counseling sessions, Psychoeducation, Medication administration, Evaluate responses to treatment, Monitor vital signs and CBGs as ordered, Perform/monitor CIWA, COWS, AIMS and Fall Risk screenings as ordered, Perform wound care treatments as ordered.  Evaluation of Outcomes: Met   LCSW Treatment Plan for Primary Diagnosis: Alcohol use disorder, severe, dependence ([place]) Long Term Goal(s): Safe transition to appropriate next level of care at discharge, Engage patient in therapeutic group addressing interpersonal concerns.  Short Term Goals: Engage patient in aftercare planning with referrals and resources, Increase social support, Increase emotional regulation, Identify triggers associated with mental health/substance abuse issues and Increase skills for wellness and recovery  Therapeutic Interventions: Assess for all discharge needs, 1 to 1 time with Social worker, Explore  available resources and support systems, Assess for adequacy in community support network, Educate family and significant other(s) on suicide prevention, Complete Psychosocial Assessment, Interpersonal group therapy.  Evaluation of Outcomes: Met   Progress in Treatment :  Attending groups: Yes  Participating in groups:  Yes  Taking medication as prescribed: Yes, MD continuing to assess for appropriate medication regimen  Toleration medication: Yes  Family/Significant other contact made: Treatment team assessing for appropriate contacts  Patient understands diagnosis: Yes  Discussing patient identified problems/goals with staff: Yes  Medical problems stabilized or resolved: Yes  Denies suicidal/homicidal ideation: Yes, self report and group   Issues/concerns per patient self-inventory: None reported  Other: N/A  New problem(s) identified: None reported at this time    New Short Term/Long Term Goal(s): None at this time    Discharge Plan or Barriers: Pt had [facility] screening. Denied due to being there too recently. [facility] screening for possible admission on Wed at 7:45AM. [facility] referral made. Pt will follow-up on his own. Pt plans to stay with  his sponsor in [facility] and goes to [facility] in [place] for outpatient follow-up.     Reason for Continuation of Hospitalization: none  Estimated Length of Stay: d/c today     Attendees:  Patient:   Physician: Dr. Parke Poisson, Dr. Modesta Messing, MD  09/04/2016   9:30am  Nursing: Elsie Saas, RN 09/04/2016 9:30am  RN Care Manager:   Social Workers: Peri Maris, LCSW, [place], LCSW   09/04/2016 9:30am  Nurse Pratictioners:  Lindell Spar, NP 09/04/2016 9:30am  Other:  09/04/2016 9:30am    Scribe for Treatment Team: Maxie Better, MSW, LCSW Clinical Social Worker 09/04/2016 10:06 AM

## 2016-09-11 ENCOUNTER — Emergency Department (HOSPITAL_COMMUNITY)
Admission: EM | Admit: 2016-09-11 | Discharge: 2016-09-11 | Disposition: A | Discharge status: Home / Self Care | Payer: Self-pay | Attending: Emergency Medicine | Admitting: Emergency Medicine

## 2016-09-11 ENCOUNTER — Emergency Department (HOSPITAL_COMMUNITY): Payer: Self-pay

## 2016-09-11 ENCOUNTER — Encounter (HOSPITAL_COMMUNITY): Payer: Self-pay | Admitting: Emergency Medicine

## 2016-09-11 DIAGNOSIS — M7989 Other specified soft tissue disorders: Secondary | ICD-10-CM | POA: Insufficient documentation

## 2016-09-11 DIAGNOSIS — Y939 Activity, unspecified: Secondary | ICD-10-CM | POA: Insufficient documentation

## 2016-09-11 DIAGNOSIS — Y999 Unspecified external cause status: Secondary | ICD-10-CM | POA: Insufficient documentation

## 2016-09-11 DIAGNOSIS — Y929 Unspecified place or not applicable: Secondary | ICD-10-CM | POA: Insufficient documentation

## 2016-09-11 DIAGNOSIS — W208XXA Other cause of strike by thrown, projected or falling object, initial encounter: Secondary | ICD-10-CM | POA: Insufficient documentation

## 2016-09-11 DIAGNOSIS — Z5321 Procedure and treatment not carried out due to patient leaving prior to being seen by health care provider: Secondary | ICD-10-CM | POA: Insufficient documentation
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
## 2016-09-11 NOTE — ED Triage Notes (Signed)
Pt was provided a pillow to elevate l/arm. Friend is sitting with pt.

## 2016-09-11 NOTE — ED Notes (Signed)
Pt did not want to wait. Pt recently smelled of alcohol did not when pt first came in.

## 2016-09-20 ENCOUNTER — Encounter (HOSPITAL_COMMUNITY): Payer: Self-pay | Admitting: Emergency Medicine

## 2016-09-20 ENCOUNTER — Emergency Department (HOSPITAL_COMMUNITY)
Admission: EM | Admit: 2016-09-20 | Discharge: 2016-09-20 | Disposition: A | Discharge status: Home / Self Care | Payer: Self-pay | Attending: Emergency Medicine | Admitting: Emergency Medicine

## 2016-09-20 DIAGNOSIS — Y929 Unspecified place or not applicable: Secondary | ICD-10-CM | POA: Insufficient documentation

## 2016-09-20 DIAGNOSIS — Y999 Unspecified external cause status: Secondary | ICD-10-CM | POA: Insufficient documentation

## 2016-09-20 DIAGNOSIS — S59902A Unspecified injury of left elbow, initial encounter: Secondary | ICD-10-CM | POA: Insufficient documentation

## 2016-09-20 DIAGNOSIS — W228XXA Striking against or struck by other objects, initial encounter: Secondary | ICD-10-CM | POA: Insufficient documentation

## 2016-09-20 DIAGNOSIS — F17210 Nicotine dependence, cigarettes, uncomplicated: Secondary | ICD-10-CM | POA: Insufficient documentation

## 2016-09-20 DIAGNOSIS — Y939 Activity, unspecified: Secondary | ICD-10-CM | POA: Insufficient documentation

## 2016-09-20 DIAGNOSIS — S5002XA Contusion of left elbow, initial encounter: Secondary | ICD-10-CM | POA: Insufficient documentation

## 2016-09-20 DIAGNOSIS — J069 Acute upper respiratory infection, unspecified: Secondary | ICD-10-CM | POA: Insufficient documentation

## 2016-09-20 DIAGNOSIS — Z79899 Other long term (current) drug therapy: Secondary | ICD-10-CM | POA: Insufficient documentation

## 2016-09-20 MED ORDER — CEPHALEXIN 250 MG PO CAPS: 250.0000 mg | ORAL_CAPSULE | Freq: Four times a day (QID) | ORAL | 0 refills | Status: DC

## 2016-09-20 NOTE — ED Triage Notes (Signed)
Pt states he was seen here a week ago for pain in his left arm  Pt states they did xrays but he left prior to getting the results  Pt states he continues to have pain in his arm  Pt also c/o congestion and cough that he has had for over a month  Pt states he had a chest xray when he was here for his arm  Pt is in police custody and has already been processed but when the jail nurse touched his arm he said it was painful so he was sent here to get cleared so he could go to jail

## 2016-09-20 NOTE — Discharge Instructions (Signed)
Keflex as prescribed.  Ibuprofen 600 mg every 6 hours as needed for pain.  Return to the ER for difficulty breathing, high fever, worsening pain, or other new and concerning symptoms.
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
## 2016-09-20 NOTE — ED Notes (Signed)
Pt ambulatory and independent at discharge.  Verbalized understanding of discharge instructions 

## 2018-08-11 DIAGNOSIS — F3132 Bipolar disorder, current episode depressed, moderate: Secondary | ICD-10-CM | POA: Insufficient documentation

## 2019-05-11 ENCOUNTER — Emergency Department (HOSPITAL_COMMUNITY): Payer: Self-pay

## 2019-05-11 ENCOUNTER — Other Ambulatory Visit: Payer: Self-pay

## 2019-05-11 ENCOUNTER — Encounter (HOSPITAL_COMMUNITY): Payer: Self-pay

## 2019-05-11 ENCOUNTER — Emergency Department (HOSPITAL_COMMUNITY)
Admission: EM | Admit: 2019-05-11 | Discharge: 2019-05-11 | Disposition: A | Discharge status: Home / Self Care | Payer: Self-pay | Attending: Emergency Medicine | Admitting: Emergency Medicine

## 2019-05-11 DIAGNOSIS — F17210 Nicotine dependence, cigarettes, uncomplicated: Secondary | ICD-10-CM | POA: Insufficient documentation

## 2019-05-11 DIAGNOSIS — H9209 Otalgia, unspecified ear: Secondary | ICD-10-CM

## 2019-05-11 DIAGNOSIS — Z79899 Other long term (current) drug therapy: Secondary | ICD-10-CM | POA: Insufficient documentation

## 2019-05-11 DIAGNOSIS — N23 Unspecified renal colic: Secondary | ICD-10-CM | POA: Insufficient documentation

## 2019-05-11 DIAGNOSIS — H9201 Otalgia, right ear: Secondary | ICD-10-CM | POA: Insufficient documentation

## 2019-05-11 MED ORDER — NAPROXEN 500 MG PO TABS
500.0000 mg | ORAL_TABLET | Freq: Once | ORAL | Status: AC
  Administered 2019-05-11: 500 mg via ORAL
  Filled 2019-05-11: qty 1

## 2019-05-11 MED ORDER — TAMSULOSIN HCL 0.4 MG PO CAPS: 0.4000 mg | ORAL_CAPSULE | Freq: Every day | ORAL | 0 refills | Status: DC

## 2019-05-11 MED ORDER — HYDROCODONE-ACETAMINOPHEN 5-325 MG PO TABS
1.0000 | ORAL_TABLET | Freq: Once | ORAL | Status: AC
  Administered 2019-05-11: 08:00:00 1 via ORAL
  Filled 2019-05-11: qty 1

## 2019-05-11 MED ORDER — CIPROFLOXACIN-DEXAMETHASONE 0.3-0.1 % OT SUSP: 4.0000 [drp] | Freq: Two times a day (BID) | OTIC | 0 refills | Status: DC

## 2019-05-11 MED ORDER — ACETAMINOPHEN ER 650 MG PO TBCR: 650.0000 mg | EXTENDED_RELEASE_TABLET | Freq: Three times a day (TID) | ORAL | 0 refills | Status: DC | PRN

## 2019-05-11 NOTE — ED Provider Notes (Signed)
[facility]-EMERGENCY DEPT Provider Note   CSN: [id number] Arrival date & time: 05/11/19  7353     History   Chief Complaint Chief Complaint  Patient presents with  . Flank Pain  . Testicle Pain    HPI Michael Cordova is a 38 y.o. male.     HPI  38 year old male with history of PTSD, alcohol abuse comes in a chief complaint of abdominal pain and earache.  His primary complaint is abdominal pain.  He reports that the pain has been present off and on for the past few days.  He had gone to [facility] and was diagnosed with kidney stones.  He did not follow-up with urology due to inability to afford co-pay.  He informs me that his pain became acutely worse this morning and the pain is radiating down to his testicle.  The pain is located in the left side.  He has associated nausea without vomiting.  Patient denies any burning with urination, blood in the urine.  He did have bloody urine last time when he went to the ED.  Patient also denies any fevers or chills  Additionally he informs me that he is having right-sided earache.  Every time he chews he has pain in the back of his ear.  The symptoms have been present for 1 month.  No drainage.  He has multiple teeth removed.  Past Medical History:  Diagnosis Date  . Alcohol abuse   . Bipolar 2 disorder (HCC)   . Manic depression (HCC)   . PTSD (post-traumatic stress disorder)     Patient Active Problem List   Diagnosis Date Noted  . Mood disorder in conditions classified elsewhere   . Alcohol use disorder, severe, dependence (HCC) 08/29/2016    Past Surgical History:  Procedure Laterality Date  . APPENDECTOMY          Home Medications    Prior to Admission medications   Medication Sig Start Date End Date Taking? Authorizing Provider  acamprosate (CAMPRAL) 333 MG tablet Take 2 tablets (666 mg total) by mouth 3 (three) times daily with meals. 09/04/16   Withrow, Everardo All, FNP  acetaminophen  (TYLENOL 8 HOUR) 650 MG CR tablet Take 1 tablet (650 mg total) by mouth every 8 (eight) hours as needed. 05/11/19   Derwood Kaplan, MD  albuterol (PROVENTIL HFA;VENTOLIN HFA) 108 (90 Base) MCG/ACT inhaler Inhale 1-2 puffs into the lungs every 4 (four) hours as needed for wheezing or shortness of breath. 09/04/16   Withrow, Everardo All, FNP  cephALEXin (KEFLEX) 250 MG capsule Take 1 capsule (250 mg total) by mouth 4 (four) times daily. 09/20/16   Geoffery Lyons, MD  ciprofloxacin-dexamethasone (CIPRODEX) OTIC suspension Place 4 drops into the right ear 2 (two) times daily. 05/11/19   Derwood Kaplan, MD  citalopram (CELEXA) 20 MG tablet Take 1 tablet (20 mg total) by mouth daily. 09/05/16   Withrow, Everardo All, FNP  gabapentin (NEURONTIN) 300 MG capsule Take 1 capsule (300 mg total) by mouth 3 (three) times daily. 09/04/16   Withrow, Everardo All, FNP  hydrOXYzine (ATARAX/VISTARIL) 25 MG tablet Take 1 tablet (25 mg total) by mouth every 6 (six) hours as needed for anxiety. 09/04/16   Withrow, Everardo All, FNP  nicotine polacrilex (NICORETTE) 2 MG gum Take 1 each (2 mg total) by mouth as needed for smoking cessation. 09/04/16   Withrow, Everardo All, FNP  tamsulosin (FLOMAX) 0.4 MG CAPS capsule Take 1 capsule (0.4 mg total) by  mouth daily. 05/11/19   Derwood KaplanNanavati, Yvone Slape, MD  thiamine 100 MG tablet Take 1 tablet (100 mg total) by mouth daily. 09/05/16   Withrow, Everardo AllJohn C, FNP  traZODone (DESYREL) 50 MG tablet Take 1 tablet (50 mg total) by mouth at bedtime. 09/04/16   Withrow, Everardo AllJohn C, FNP    Family History No family history on file.  Social History Social History   Tobacco Use  . Smoking status: Current Every Day Smoker    Packs/day: 1.50    Years: 7.00    Pack years: 10.50    Types: Cigarettes  . Smokeless tobacco: Never Used  Substance Use Topics  . Alcohol use: Yes    Comment: drinks 4- fifths of liquor daily  . Drug use: No     Allergies   Penicillins   Review of Systems Review of Systems  Constitutional:  Positive for activity change.  Cardiovascular: Negative for chest pain.  Gastrointestinal: Positive for abdominal pain, nausea and vomiting.  Genitourinary: Positive for flank pain, scrotal swelling and testicular pain. Negative for dysuria, frequency and hematuria.  All other systems reviewed and are negative.    Physical Exam Updated Vital Signs BP 128/80 (BP Location: Right Arm)   Pulse 70   Temp 98.5 F (36.9 C) (Oral)   Resp 18   Ht 6' (1.829 m)   Wt 81.6 kg   SpO2 100%   BMI 24.41 kg/m   Physical Exam Vitals signs and nursing note reviewed.  Constitutional:      Appearance: He is well-developed.  HENT:     Head: Atraumatic.     Mouth/Throat:     Comments: Oral exam did not reveal any trismus.  Patient does not have any gingival edema or fluctuance. Patient is missing multiple teeth over the posterior aspect of the mouth on both sides. Neck:     Musculoskeletal: Neck supple.  Cardiovascular:     Rate and Rhythm: Normal rate.  Pulmonary:     Effort: Pulmonary effort is normal.  Abdominal:     Tenderness: There is no abdominal tenderness. There is no guarding.  Lymphadenopathy:     Cervical: No cervical adenopathy.  Skin:    General: Skin is warm.  Neurological:     Mental Status: He is alert and oriented to person, place, and time.      ED Treatments / Results  Labs (all labs ordered are listed, but only abnormal results are displayed) Labs Reviewed - No data to display  EKG None  Radiology [facility]  Result Date: 05/11/2019 CLINICAL DATA:  Abdominal pain and history of renal calculi EXAM: RENAL / URINARY TRACT ULTRASOUND COMPLETE COMPARISON:  04/19/2019 FINDINGS: Right Kidney: Renal measurements: 11.4 x 6.6 x 5.8 cm. = volume: 226 mL . Echogenicity within normal limits. No mass or hydronephrosis visualized. Left Kidney: Renal measurements: 11.0 x 5.6 x 5.3 cm = volume: 170.4 mL. No hydronephrosis is noted. Nonobstructing stone is noted in the upper pole  consistent with that seen on recent CT examination. The previously seen renal pelvic stone now appears to lie within the lower pole. Bladder: Well distended.  Mild debris is noted within the bladder. IMPRESSION: Left-sided renal stones similar to that noted on prior CT. No obstructive changes are noted. Mild debris within the urinary bladder. Correlate with any possible urinary tract infection. Electronically Signed   By: Alcide CleverMark  Lukens M.D.   On: 05/11/2019 08:36    Procedures Procedures (including critical care time)  Medications Ordered in ED Medications  naproxen (NAPROSYN) tablet 500 mg (500 mg Oral Given 05/11/19 0803)  HYDROcodone-acetaminophen (NORCO/VICODIN) 5-325 MG per tablet 1 tablet (1 tablet Oral Given 05/11/19 0803)     Initial Impression / Assessment and Plan / ED Course  I have reviewed the triage vital signs and the nursing notes.  Pertinent labs & imaging results that were available during my care of the patient were reviewed by me and considered in my medical decision making (see chart for details).        Patient comes in a chief complaint of abdominal pain and left testicle pain. He has history of kidney stones.  Ultrasound ordered.  Patient's pain is about 6 out of 10 right now.  Additionally, his ear exam shows the external canal on the right side erythematous without any drainage.  Oral exam does not reveal any evidence of dental infection or abscess.  Patient does not have any trismus or severe lymphadenopathy.  9:29 AM Patient reassessed.  His pain is resolved.  Based on the ultrasound finding we did we discussed any UTI-like symptoms and patient states that there is some discomfort with urination, but no burning or hematuria at this time.  I do not think he has UTI or pyelonephritis.  Stable for discharge.  Strict ER return precautions have been discussed, and patient is agreeing with the plan and is comfortable with the workup done and the recommendations from the  ER.   Final Clinical Impressions(s) / ED Diagnoses   Final diagnoses:  Ureteral colic  Earache    ED Discharge Orders         Ordered    ciprofloxacin-dexamethasone (CIPRODEX) OTIC suspension  2 times daily     05/11/19 0923    acetaminophen (TYLENOL 8 HOUR) 650 MG CR tablet  Every 8 hours PRN     05/11/19 0923    tamsulosin (FLOMAX) 0.4 MG CAPS capsule  Daily     05/11/19 0923           Varney Biles, MD 05/11/19 0930

## 2019-05-11 NOTE — Discharge Instructions (Addendum)
We saw you in the ER for pain. Our results indicate that you have a kidney stone. We were able to get your pain is relative control, and we can safely send you home.  Take the meds prescribed. Set up an appointment with the Urologist. If the pain is unbearable, you start having fevers, chills, and are unable to keep any meds down - then return to the ER.  Also, prescribed is the topical antibiotic for suspected otitis externa.

## 2019-05-11 NOTE — ED Triage Notes (Signed)
Pt c/o of L flank pain radiating towards his testicles.  Pt hx of kidney stones.  Pt seen in an ED 3 weeks ago and he stated he was told he had a 23mm kidney stone.  Pt states he has had this pain for a month, and the pain has not moved any.

## 2019-05-28 ENCOUNTER — Emergency Department (HOSPITAL_COMMUNITY)
Admission: EM | Admit: 2019-05-28 | Discharge: 2019-05-28 | Disposition: A | Discharge status: Home / Self Care | Payer: Self-pay | Attending: Emergency Medicine | Admitting: Emergency Medicine

## 2019-05-28 ENCOUNTER — Encounter (HOSPITAL_COMMUNITY): Payer: Self-pay

## 2019-05-28 ENCOUNTER — Other Ambulatory Visit: Payer: Self-pay

## 2019-05-28 DIAGNOSIS — H9201 Otalgia, right ear: Secondary | ICD-10-CM | POA: Insufficient documentation

## 2019-05-28 DIAGNOSIS — Z5321 Procedure and treatment not carried out due to patient leaving prior to being seen by health care provider: Secondary | ICD-10-CM | POA: Insufficient documentation

## 2019-05-28 DIAGNOSIS — R109 Unspecified abdominal pain: Secondary | ICD-10-CM | POA: Insufficient documentation

## 2019-05-28 DIAGNOSIS — N50819 Testicular pain, unspecified: Secondary | ICD-10-CM | POA: Insufficient documentation

## 2019-05-28 LAB — URINALYSIS, ROUTINE W REFLEX MICROSCOPIC
Bacteria, UA: NONE SEEN
Bilirubin Urine: NEGATIVE
Glucose, UA: NEGATIVE mg/dL
Ketones, ur: NEGATIVE mg/dL
Leukocytes,Ua: NEGATIVE
Nitrite: NEGATIVE
Protein, ur: 30 mg/dL — AB
RBC / HPF: >50 RBC/hpf — ABNORMAL HIGH (ref 0–5)
Specific Gravity, Urine: 1.015 (ref 1.005–1.030)
pH: 7 (ref 5.0–8.0)

## 2019-05-28 NOTE — ED Triage Notes (Signed)
Patient c/o left flank pain that radiates into his scrotum. Patient was seen on 8/31 for the same. Patient also reports hematuria and states he has a known kidney stone.  Patient also c/o right ear pain and states the pain never went away after his last visit.

## 2019-06-12 DIAGNOSIS — N200 Calculus of kidney: Secondary | ICD-10-CM | POA: Insufficient documentation

## 2019-08-05 ENCOUNTER — Emergency Department (HOSPITAL_COMMUNITY)
Admission: EM | Admit: 2019-08-05 | Discharge: 2019-08-05 | Disposition: A | Discharge status: Home / Self Care | Payer: Self-pay | Attending: Emergency Medicine | Admitting: Emergency Medicine

## 2019-08-05 ENCOUNTER — Encounter (HOSPITAL_COMMUNITY): Payer: Self-pay | Admitting: Emergency Medicine

## 2019-08-05 ENCOUNTER — Other Ambulatory Visit: Payer: Self-pay

## 2019-08-05 ENCOUNTER — Emergency Department (HOSPITAL_COMMUNITY): Payer: Self-pay

## 2019-08-05 DIAGNOSIS — N201 Calculus of ureter: Secondary | ICD-10-CM | POA: Insufficient documentation

## 2019-08-05 DIAGNOSIS — Z79899 Other long term (current) drug therapy: Secondary | ICD-10-CM | POA: Insufficient documentation

## 2019-08-05 DIAGNOSIS — F17210 Nicotine dependence, cigarettes, uncomplicated: Secondary | ICD-10-CM | POA: Insufficient documentation

## 2019-08-05 DIAGNOSIS — N1330 Unspecified hydronephrosis: Secondary | ICD-10-CM | POA: Insufficient documentation

## 2019-08-05 DIAGNOSIS — N200 Calculus of kidney: Secondary | ICD-10-CM

## 2019-08-05 LAB — URINALYSIS, ROUTINE W REFLEX MICROSCOPIC
Bacteria, UA: NONE SEEN
Bilirubin Urine: NEGATIVE
Glucose, UA: NEGATIVE mg/dL
Ketones, ur: NEGATIVE mg/dL
Leukocytes,Ua: NEGATIVE
Nitrite: NEGATIVE
Protein, ur: NEGATIVE mg/dL
Specific Gravity, Urine: 1.003 — ABNORMAL LOW (ref 1.005–1.030)
pH: 5 (ref 5.0–8.0)

## 2019-08-05 NOTE — ED Triage Notes (Signed)
Pt c/o of hematuria and left flank pain since this morning at 6.

## 2019-08-05 NOTE — ED Notes (Signed)
Called PALS line to [facility] for Dr. Stark Jock

## 2019-08-05 NOTE — Discharge Instructions (Addendum)
Call the urology office at [facility] to schedule a follow-up appointment.  I have discussed your care with Dr. Warrick Parisian and he is aware of your situation.  Return to the emergency department if you develop high fever, worsening pain, or other new and concerning symptoms.

## 2019-08-05 NOTE — ED Provider Notes (Signed)
[facility] EMERGENCY DEPARTMENT Provider Note   CSN: [id number] Arrival date & time: 08/05/19  5035     History   Chief Complaint Chief Complaint  Patient presents with  . Hematuria    HPI Michael Cordova is a 38 y.o. male.     Patient is a 38 year old male with past medical history of bipolar disorder, manic depression, PTSD, and polysubstance abuse in recovery.  Patient presents today for evaluation of left flank pain, pain in his left groin, and urinating blood.  Patient states that this has been ongoing for several months.  He tells me he recently had a 6 mm stone removed at [facility].  He began urinating blood several days ago.  He denies fevers or chills.  The history is provided by the patient.  Hematuria This is a new problem. The current episode started 2 days ago. The problem has been gradually worsening. Associated symptoms include abdominal pain. Nothing aggravates the symptoms. Nothing relieves the symptoms. He has tried nothing for the symptoms.    Past Medical History:  Diagnosis Date  . Alcohol abuse   . Bipolar 2 disorder (HCC)   . Manic depression (HCC)   . PTSD (post-traumatic stress disorder)     Patient Active Problem List   Diagnosis Date Noted  . Mood disorder in conditions classified elsewhere   . Alcohol use disorder, severe, dependence (HCC) 08/29/2016    Past Surgical History:  Procedure Laterality Date  . APPENDECTOMY          Home Medications    Prior to Admission medications   Medication Sig Start Date End Date Taking? Authorizing Provider  acamprosate (CAMPRAL) 333 MG tablet Take 2 tablets (666 mg total) by mouth 3 (three) times daily with meals. 09/04/16   Withrow, Everardo All, FNP  acetaminophen (TYLENOL 8 HOUR) 650 MG CR tablet Take 1 tablet (650 mg total) by mouth every 8 (eight) hours as needed. 05/11/19   Derwood Kaplan, MD  albuterol (PROVENTIL HFA;VENTOLIN HFA) 108 (90 Base) MCG/ACT inhaler Inhale 1-2 puffs into the lungs every 4  (four) hours as needed for wheezing or shortness of breath. 09/04/16   Withrow, Everardo All, FNP  cephALEXin (KEFLEX) 250 MG capsule Take 1 capsule (250 mg total) by mouth 4 (four) times daily. 09/20/16   Geoffery Lyons, MD  ciprofloxacin-dexamethasone (CIPRODEX) OTIC suspension Place 4 drops into the right ear 2 (two) times daily. 05/11/19   Derwood Kaplan, MD  citalopram (CELEXA) 20 MG tablet Take 1 tablet (20 mg total) by mouth daily. 09/05/16   Withrow, Everardo All, FNP  gabapentin (NEURONTIN) 300 MG capsule Take 1 capsule (300 mg total) by mouth 3 (three) times daily. 09/04/16   Withrow, Everardo All, FNP  hydrOXYzine (ATARAX/VISTARIL) 25 MG tablet Take 1 tablet (25 mg total) by mouth every 6 (six) hours as needed for anxiety. 09/04/16   Withrow, Everardo All, FNP  nicotine polacrilex (NICORETTE) 2 MG gum Take 1 each (2 mg total) by mouth as needed for smoking cessation. 09/04/16   Withrow, Everardo All, FNP  tamsulosin (FLOMAX) 0.4 MG CAPS capsule Take 1 capsule (0.4 mg total) by mouth daily. 05/11/19   Derwood Kaplan, MD  thiamine 100 MG tablet Take 1 tablet (100 mg total) by mouth daily. 09/05/16   Withrow, Everardo All, FNP  traZODone (DESYREL) 50 MG tablet Take 1 tablet (50 mg total) by mouth at bedtime. 09/04/16   Withrow, Everardo All, FNP    Family History History reviewed. No pertinent family history.  Social History Social History   Tobacco Use  . Smoking status: Current Every Day Smoker    Packs/day: 1.50    Years: 7.00    Pack years: 10.50    Types: Cigarettes  . Smokeless tobacco: Never Used  Substance Use Topics  . Alcohol use: Not Currently    Comment: drinks 4- fifths of liquor daily  . Drug use: Yes     Allergies   Penicillins   Review of Systems Review of Systems  Gastrointestinal: Positive for abdominal pain.  Genitourinary: Positive for hematuria.  All other systems reviewed and are negative.    Physical Exam Updated Vital Signs BP 134/89   Pulse (!) 101   Temp 98.2 F (36.8 C) (Oral)    Resp 12   Ht 6' (1.829 m)   Wt 79.4 kg   SpO2 97%   BMI 23.73 kg/m   Physical Exam Vitals signs and nursing note reviewed.  Constitutional:      General: He is not in acute distress.    Appearance: He is well-developed. He is not diaphoretic.  HENT:     Head: Normocephalic and atraumatic.  Neck:     Musculoskeletal: Normal range of motion and neck supple.  Cardiovascular:     Rate and Rhythm: Normal rate and regular rhythm.     Heart sounds: No murmur. No friction rub.  Pulmonary:     Effort: Pulmonary effort is normal. No respiratory distress.     Breath sounds: Normal breath sounds. No wheezing or rales.  Abdominal:     General: Bowel sounds are normal. There is no distension.     Palpations: Abdomen is soft.     Tenderness: There is no abdominal tenderness. There is left CVA tenderness.  Musculoskeletal: Normal range of motion.  Skin:    General: Skin is warm and dry.  Neurological:     Mental Status: He is alert and oriented to person, place, and time.     Coordination: Coordination normal.      ED Treatments / Results  Labs (all labs ordered are listed, but only abnormal results are displayed) Labs Reviewed  URINALYSIS, ROUTINE W REFLEX MICROSCOPIC    EKG None  Radiology No results found.  Procedures Procedures (including critical care time)  Medications Ordered in ED Medications - No data to display   Initial Impression / Assessment and Plan / ED Course  I have reviewed the triage vital signs and the nursing notes.  Pertinent labs & imaging results that were available during my care of the patient were reviewed by me and considered in my medical decision making (see chart for details).  Patient is a 38 year old male presenting with complaints of left flank and groin pain.  Last month, patient underwent a retrieval of a left renal calculus at [facility].  Patient is having ongoing pain and intermittent hematuria.  Due to the patient's continued  pain, a CT scan was obtained as well as urinalysis.  His urine does not show infection, just small hemoglobin.  CT scan does show a 5 x 6 mm calculus in the proximal ureter.  In comparing with his chart, it would appear as though the stent that was believed to have been removed remains.  I discussed this with Dr. Joya Martyrsivian [facility].  He is advising the patient follow-up in the next few days in the clinic.  Patient will be discharged and is to follow-up with them as soon as possible.  Final Clinical Impressions(s) /  ED Diagnoses   Final diagnoses:  None    ED Discharge Orders    None       Veryl Speak, MD 08/05/19 [phone]

## 2019-11-04 ENCOUNTER — Encounter (HOSPITAL_COMMUNITY): Payer: Self-pay | Admitting: Emergency Medicine

## 2019-11-04 ENCOUNTER — Other Ambulatory Visit: Payer: Self-pay

## 2019-11-04 ENCOUNTER — Emergency Department (HOSPITAL_COMMUNITY)
Admission: EM | Admit: 2019-11-04 | Discharge: 2019-11-04 | Discharge status: Against Medical Advice | Payer: Self-pay | Attending: Emergency Medicine | Admitting: Emergency Medicine

## 2019-11-04 DIAGNOSIS — R1032 Left lower quadrant pain: Secondary | ICD-10-CM | POA: Insufficient documentation

## 2019-11-04 DIAGNOSIS — Z79899 Other long term (current) drug therapy: Secondary | ICD-10-CM | POA: Insufficient documentation

## 2019-11-04 DIAGNOSIS — F17210 Nicotine dependence, cigarettes, uncomplicated: Secondary | ICD-10-CM | POA: Insufficient documentation

## 2019-11-04 DIAGNOSIS — Z5320 Procedure and treatment not carried out because of patient's decision for unspecified reasons: Secondary | ICD-10-CM | POA: Insufficient documentation

## 2019-11-04 DIAGNOSIS — R109 Unspecified abdominal pain: Secondary | ICD-10-CM

## 2019-11-04 MED ORDER — ONDANSETRON 8 MG PO TBDP: 8.0000 mg | ORAL_TABLET | Freq: Once | ORAL | Status: DC

## 2019-11-04 MED ORDER — KETOROLAC TROMETHAMINE 30 MG/ML IJ SOLN: 30.0000 mg | Freq: Once | INTRAMUSCULAR | Status: DC

## 2019-11-04 NOTE — ED Notes (Signed)
Pt was in triage trying to go outside to smoke. Pt was informed that he can not go outside to smoke while he is a patient being seen. Pt offered a nicotine patch, states "they have been saying that for hours". Pt informed that if he leaves he leaves here against medical advice. He was informed that if he wants medical treatment then he will need to go back to the treatment room. Pt states that he was leaving and left.

## 2019-11-04 NOTE — ED Provider Notes (Signed)
[facility]-EMERGENCY DEPT Provider Note   CSN: [id number] Arrival date & time: 11/04/19  1204     History Chief Complaint  Patient presents with  . Hematuria  . Dysuria  . detox    Michael Cordova is a 39 y.o. male.  39 year old male with past medical history of kidney stones, alcohol abuse, polysubstance abuse presents with complaint of left flank pain and hematuria x2 days.  Also requesting detox from alcohol and meth.  Patient states that he has a left-sided kidney stone, has had 2 surgeries for this previously, review of records shows last surgery on January 14 with successful stone removal, no further stones in the left ureter at that time.  Patient states for 2 days he has had intermittent pain with dysuria.  Denies changes in bowel habits.  Reports feeling nauseous, no vomiting, no chills.  Patient last drank half of a 40 ounce beer 5 minutes prior to arrival in the emergency room.  Patient states he regularly drinks a case of beer to a pint and a half of liquor daily.  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  Also smokes marijuana.  Patient reports he is an alcoholic and has been dry 7 out of 10 years however due to multiple deaths in his family recently he has been drinking nonstop.  No other complaints or concerns today.        Past Medical History:  Diagnosis Date  . Alcohol abuse   . Bipolar 2 disorder (HCC)   . Manic depression (HCC)   . PTSD (post-traumatic stress disorder)     Patient Active Problem List   Diagnosis Date Noted  . Mood disorder in conditions classified elsewhere   . Alcohol use disorder, severe, dependence (HCC) 08/29/2016    Past Surgical History:  Procedure Laterality Date  . APPENDECTOMY         No family history on file.  Social History   Tobacco Use  . Smoking status: Current Every Day Smoker   Packs/day: 1.50    Years: 7.00    Pack years: 10.50    Types: Cigarettes  . Smokeless tobacco: Never Used  Substance Use Topics  . Alcohol use: Yes    Comment: drinks 2- fifths of liquor daily  . Drug use: Yes    Home Medications Prior to Admission medications   Medication Sig Start Date End Date Taking? Authorizing Provider  acamprosate (CAMPRAL) 333 MG tablet Take 2 tablets (666 mg total) by mouth 3 (three) times daily with meals. 09/04/16   Withrow, Everardo All, FNP  acetaminophen (TYLENOL 8 HOUR) 650 MG CR tablet Take 1 tablet (650 mg total) by mouth every 8 (eight) hours as needed. 05/11/19   Derwood Kaplan, MD  albuterol (PROVENTIL HFA;VENTOLIN HFA) 108 (90 Base) MCG/ACT inhaler Inhale 1-2 puffs into the lungs every 4 (four) hours as needed for wheezing or shortness of breath. 09/04/16   Withrow, Everardo All, FNP  cephALEXin (KEFLEX) 250 MG capsule Take 1 capsule (250 mg total) by mouth 4 (four) times daily. 09/20/16   Geoffery Lyons, MD  ciprofloxacin-dexamethasone (CIPRODEX) OTIC suspension Place 4 drops into the right ear 2 (two) times daily. 05/11/19   Derwood Kaplan, MD  citalopram (CELEXA) 20 MG tablet Take 1 tablet (20 mg total) by mouth daily. 09/05/16   Withrow, Everardo All, FNP  gabapentin (NEURONTIN)  300 MG capsule Take 1 capsule (300 mg total) by mouth 3 (three) times daily. 09/04/16   Withrow, Everardo All, FNP  hydrOXYzine (ATARAX/VISTARIL) 25 MG tablet Take 1 tablet (25 mg total) by mouth every 6 (six) hours as needed for anxiety. 09/04/16   Withrow, Everardo All, FNP  nicotine polacrilex (NICORETTE) 2 MG gum Take 1 each (2 mg total) by mouth as needed for smoking cessation. 09/04/16   Withrow, Everardo All, FNP  tamsulosin (FLOMAX) 0.4 MG CAPS capsule Take 1 capsule (0.4 mg total) by mouth daily. 05/11/19   Derwood Kaplan, MD  thiamine 100 MG tablet Take 1 tablet (100 mg total) by mouth daily. 09/05/16   Withrow, Everardo All, FNP  traZODone (DESYREL) 50 MG tablet Take 1 tablet (50 mg total) by mouth at  bedtime. 09/04/16   Withrow, Everardo All, FNP    Allergies    Penicillins  Review of Systems   Review of Systems  Constitutional: Negative for chills, diaphoresis and fever.  Respiratory: Negative for shortness of breath.   Cardiovascular: Negative for chest pain.  Gastrointestinal: Positive for nausea. Negative for constipation, diarrhea and vomiting.  Genitourinary: Positive for dysuria, flank pain and hematuria.  Musculoskeletal: Negative for myalgias.  Skin: Negative for rash and wound.  Allergic/Immunologic: Negative for immunocompromised state.  Neurological: Negative for weakness.  Hematological: Negative for adenopathy.  Psychiatric/Behavioral: Negative for confusion.  All other systems reviewed and are negative.   Physical Exam Updated Vital Signs BP (!) 136/98 (BP Location: Left Arm)   Pulse (!) 102   Temp 98.3 F (36.8 C) (Oral)   Resp 16   SpO2 98%   Physical Exam Vitals and nursing note reviewed.  Constitutional:      General: He is not in acute distress.    Appearance: He is well-developed. He is not diaphoretic.  HENT:     Head: Normocephalic and atraumatic.  Cardiovascular:     Rate and Rhythm: Normal rate and regular rhythm.     Pulses: Normal pulses.     Heart sounds: Normal heart sounds.  Pulmonary:     Effort: Pulmonary effort is normal.     Breath sounds: Normal breath sounds.  Abdominal:     Palpations: Abdomen is soft.     Tenderness: There is no abdominal tenderness. There is no right CVA tenderness or left CVA tenderness.  Musculoskeletal:     Right lower leg: No edema.     Left lower leg: No edema.  Skin:    General: Skin is warm and dry.     Findings: No erythema or rash.  Neurological:     Mental Status: He is alert and oriented to person, place, and time.  Psychiatric:        Behavior: Behavior normal.     ED Results / Procedures / Treatments   Labs (all labs ordered are listed, but only abnormal results are displayed) Labs  Reviewed  BASIC METABOLIC PANEL  CBC WITH DIFFERENTIAL/PLATELET  RAPID URINE DRUG SCREEN, HOSP PERFORMED  URINALYSIS, ROUTINE W REFLEX MICROSCOPIC  HEPATIC FUNCTION PANEL    EKG None  Radiology No results found.  Procedures Procedures (including critical care time)  Medications Ordered in ED Medications  ketorolac (TORADOL) 30 MG/ML injection 30 mg (has no administration in time range)  ondansetron (ZOFRAN-ODT) disintegrating tablet 8 mg (has no administration in time range)    ED Course  I have reviewed the triage vital signs and the nursing notes.  Pertinent labs & imaging results that  were available during my care of the patient were reviewed by me and considered in my medical decision making (see chart for details).  Clinical Course as of Nov 03 1410  Wed Nov 04, 2019  1411 Informed patient has left the department, I was not given the opportunity to talk to the patient before he decided to elope.   [LM]    Clinical Course User Index [LM] Roque Lias   MDM Rules/Calculators/A&P                      Final Clinical Impression(s) / ED Diagnoses Final diagnoses:  Flank pain    Rx / DC Orders ED Discharge Orders    None       Roque Lias 11/04/19 1412    Little, Wenda Overland, MD 11/05/19 2008

## 2019-11-04 NOTE — ED Notes (Signed)
Never had the opportunity to care or assess patient prior to him leaving AMA due to caring for a critical patient.

## 2019-11-04 NOTE — Patient Outreach (Addendum)
CPSS tried meeting with the patient in-person at the [facility] in order to provide substance use recovery support and help with getting connected to substance use treatment resources, but patient left AMA before CPSS had a chance to meet with the patient. Per triage note, patient arrived at the [facility] reporting a history of daily alcohol use. Per triage note, patient also reported that he is currently experiencing homelessness.

## 2019-11-04 NOTE — ED Triage Notes (Signed)
Pt reports that has hx kidney stones and for past 3 days having dysuria, some retention and pink blood in urine. Reports that he needs help with his alcohol consumption. Since October when he started back drinking he has been drinking case to 2 5ths per day. Is homeless. Denies IS or HI.

## 2019-11-19 ENCOUNTER — Other Ambulatory Visit: Payer: Self-pay

## 2019-11-19 ENCOUNTER — Emergency Department (HOSPITAL_COMMUNITY): Payer: Self-pay

## 2019-11-19 ENCOUNTER — Encounter (HOSPITAL_COMMUNITY): Payer: Self-pay

## 2019-11-19 ENCOUNTER — Emergency Department (HOSPITAL_COMMUNITY)
Admission: EM | Admit: 2019-11-19 | Discharge: 2019-11-19 | Disposition: A | Discharge status: Home / Self Care | Payer: Self-pay | Attending: Emergency Medicine | Admitting: Emergency Medicine

## 2019-11-19 DIAGNOSIS — Z5321 Procedure and treatment not carried out due to patient leaving prior to being seen by health care provider: Secondary | ICD-10-CM | POA: Insufficient documentation

## 2019-11-19 DIAGNOSIS — F10239 Alcohol dependence with withdrawal, unspecified: Secondary | ICD-10-CM | POA: Insufficient documentation

## 2019-11-19 LAB — BASIC METABOLIC PANEL
Anion gap: 15 (ref 5–15)
BUN: 17 mg/dL (ref 6–20)
CO2: 21 mmol/L — ABNORMAL LOW (ref 22–32)
Calcium: 9.6 mg/dL (ref 8.9–10.3)
Chloride: 93 mmol/L — ABNORMAL LOW (ref 98–111)
Creatinine, Ser: 1.22 mg/dL (ref 0.61–1.24)
GFR calc Af Amer: >60 mL/min (ref >60)
GFR calc non Af Amer: >60 mL/min (ref >60)
Glucose, Bld: 87 mg/dL (ref 70–99)
Potassium: 4.1 mmol/L (ref 3.5–5.1)
Sodium: 129 mmol/L — ABNORMAL LOW (ref 135–145)

## 2019-11-19 LAB — URINALYSIS, ROUTINE W REFLEX MICROSCOPIC
Bacteria, UA: NONE SEEN
Bilirubin Urine: NEGATIVE
Glucose, UA: NEGATIVE mg/dL
Ketones, ur: 5 mg/dL — AB
Leukocytes,Ua: NEGATIVE
Nitrite: NEGATIVE
Protein, ur: NEGATIVE mg/dL
Specific Gravity, Urine: 1.005 (ref 1.005–1.030)
pH: 5 (ref 5.0–8.0)

## 2019-11-19 LAB — CBC
HCT: 42.3 % (ref 39.0–52.0)
Hemoglobin: 15 g/dL (ref 13.0–17.0)
MCH: 32.3 pg (ref 26.0–34.0)
MCHC: 35.5 g/dL (ref 30.0–36.0)
MCV: 91.2 fL (ref 80.0–100.0)
Platelets: 249 10*3/uL (ref 150–400)
RBC: 4.64 MIL/uL (ref 4.22–5.81)
RDW: 12.9 % (ref 11.5–15.5)
WBC: 13.1 10*3/uL — ABNORMAL HIGH (ref 4.0–10.5)
nRBC: 0 % (ref 0.0–0.2)

## 2019-11-19 LAB — ETHANOL: Alcohol, Ethyl (B): <10 mg/dL (ref <10)

## 2019-11-19 LAB — TROPONIN I (HIGH SENSITIVITY): Troponin I (High Sensitivity): 4 ng/L (ref <18)

## 2019-11-19 MED ORDER — SODIUM CHLORIDE 0.9% FLUSH: 3.0000 mL | Freq: Once | INTRAVENOUS | Status: DC

## 2019-11-19 NOTE — ED Notes (Signed)
Pt ambulatory to sort desk stating that he is leaving at this time, encouraged pt to stay.

## 2019-11-19 NOTE — ED Triage Notes (Signed)
Pt arrives via POV w/ c/o alcohol withdrawal. States he drinks 8 40 oz beers per day, last drink at lunch time today.

## 2019-11-19 NOTE — ED Notes (Signed)
Pt returned to lobby from outside.

## 2019-12-13 ENCOUNTER — Emergency Department (HOSPITAL_COMMUNITY): Payer: Self-pay

## 2019-12-13 ENCOUNTER — Other Ambulatory Visit: Payer: Self-pay

## 2019-12-13 ENCOUNTER — Emergency Department (HOSPITAL_COMMUNITY)
Admission: EM | Admit: 2019-12-13 | Discharge: 2019-12-13 | Disposition: A | Discharge status: Home / Self Care | Payer: Self-pay | Attending: Emergency Medicine | Admitting: Emergency Medicine

## 2019-12-13 DIAGNOSIS — Y92099 Unspecified place in other non-institutional residence as the place of occurrence of the external cause: Secondary | ICD-10-CM | POA: Insufficient documentation

## 2019-12-13 DIAGNOSIS — W19XXXA Unspecified fall, initial encounter: Secondary | ICD-10-CM

## 2019-12-13 DIAGNOSIS — S61411A Laceration without foreign body of right hand, initial encounter: Secondary | ICD-10-CM

## 2019-12-13 DIAGNOSIS — F17210 Nicotine dependence, cigarettes, uncomplicated: Secondary | ICD-10-CM | POA: Insufficient documentation

## 2019-12-13 DIAGNOSIS — Y939 Activity, unspecified: Secondary | ICD-10-CM | POA: Insufficient documentation

## 2019-12-13 DIAGNOSIS — T07XXXA Unspecified multiple injuries, initial encounter: Secondary | ICD-10-CM

## 2019-12-13 DIAGNOSIS — W132XXA Fall from, out of or through roof, initial encounter: Secondary | ICD-10-CM | POA: Insufficient documentation

## 2019-12-13 DIAGNOSIS — S61213A Laceration without foreign body of left middle finger without damage to nail, initial encounter: Secondary | ICD-10-CM | POA: Insufficient documentation

## 2019-12-13 DIAGNOSIS — S61211A Laceration without foreign body of left index finger without damage to nail, initial encounter: Secondary | ICD-10-CM | POA: Insufficient documentation

## 2019-12-13 DIAGNOSIS — S61217A Laceration without foreign body of left little finger without damage to nail, initial encounter: Secondary | ICD-10-CM | POA: Insufficient documentation

## 2019-12-13 DIAGNOSIS — Z23 Encounter for immunization: Secondary | ICD-10-CM | POA: Insufficient documentation

## 2019-12-13 DIAGNOSIS — Z79899 Other long term (current) drug therapy: Secondary | ICD-10-CM | POA: Insufficient documentation

## 2019-12-13 DIAGNOSIS — Y990 Civilian activity done for income or pay: Secondary | ICD-10-CM | POA: Insufficient documentation

## 2019-12-13 DIAGNOSIS — S61215A Laceration without foreign body of left ring finger without damage to nail, initial encounter: Secondary | ICD-10-CM | POA: Insufficient documentation

## 2019-12-13 LAB — CBC
HCT: 38.7 % — ABNORMAL LOW (ref 39.0–52.0)
Hemoglobin: 12.9 g/dL — ABNORMAL LOW (ref 13.0–17.0)
MCH: 31.7 pg (ref 26.0–34.0)
MCHC: 33.3 g/dL (ref 30.0–36.0)
MCV: 95.1 fL (ref 80.0–100.0)
Platelets: 183 10*3/uL (ref 150–400)
RBC: 4.07 MIL/uL — ABNORMAL LOW (ref 4.22–5.81)
RDW: 12.8 % (ref 11.5–15.5)
WBC: 9 10*3/uL (ref 4.0–10.5)
nRBC: 0 % (ref 0.0–0.2)

## 2019-12-13 LAB — PROTIME-INR
INR: 1 (ref 0.8–1.2)
Prothrombin Time: 12.6 seconds (ref 11.4–15.2)

## 2019-12-13 LAB — COMPREHENSIVE METABOLIC PANEL
ALT: 32 U/L (ref 0–44)
AST: 34 U/L (ref 15–41)
Albumin: 4.2 g/dL (ref 3.5–5.0)
Alkaline Phosphatase: 57 U/L (ref 38–126)
Anion gap: 12 (ref 5–15)
BUN: 10 mg/dL (ref 6–20)
CO2: 22 mmol/L (ref 22–32)
Calcium: 8.7 mg/dL — ABNORMAL LOW (ref 8.9–10.3)
Chloride: 107 mmol/L (ref 98–111)
Creatinine, Ser: 1.08 mg/dL (ref 0.61–1.24)
GFR calc Af Amer: >60 mL/min (ref >60)
GFR calc non Af Amer: >60 mL/min (ref >60)
Glucose, Bld: 102 mg/dL — ABNORMAL HIGH (ref 70–99)
Potassium: 3.7 mmol/L (ref 3.5–5.1)
Sodium: 141 mmol/L (ref 135–145)
Total Bilirubin: 0.6 mg/dL (ref 0.3–1.2)
Total Protein: 6.8 g/dL (ref 6.5–8.1)

## 2019-12-13 LAB — SAMPLE TO BLOOD BANK

## 2019-12-13 LAB — ETHANOL: Alcohol, Ethyl (B): 221 mg/dL — ABNORMAL HIGH (ref <10)

## 2019-12-13 LAB — LACTIC ACID, PLASMA: Lactic Acid, Venous: 2.6 mmol/L (ref 0.5–1.9)

## 2019-12-13 MED ORDER — DOXYCYCLINE HYCLATE 100 MG PO TABS
100.0000 mg | ORAL_TABLET | Freq: Once | ORAL | Status: AC
  Administered 2019-12-13: 100 mg via ORAL
  Filled 2019-12-13: qty 1

## 2019-12-13 MED ORDER — SODIUM CHLORIDE 0.9 % IV BOLUS
1000.0000 mL | Freq: Once | INTRAVENOUS | Status: AC
  Administered 2019-12-13: 1000 mL via INTRAVENOUS

## 2019-12-13 MED ORDER — ONDANSETRON HCL 4 MG/2ML IJ SOLN
4.0000 mg | Freq: Once | INTRAMUSCULAR | Status: AC
  Administered 2019-12-13: 4 mg via INTRAVENOUS
  Filled 2019-12-13: qty 2

## 2019-12-13 MED ORDER — NICOTINE 21 MG/24HR TD PT24
21.0000 mg | MEDICATED_PATCH | Freq: Once | TRANSDERMAL | Status: DC
  Administered 2019-12-13: 21 mg via TRANSDERMAL
  Filled 2019-12-13: qty 1

## 2019-12-13 MED ORDER — DOXYCYCLINE HYCLATE 100 MG PO CAPS: 100.0000 mg | ORAL_CAPSULE | Freq: Two times a day (BID) | ORAL | 0 refills | Status: DC

## 2019-12-13 MED ORDER — BACITRACIN-NEOMYCIN-POLYMYXIN 400-5-5000 EX OINT: 1.0000 "application " | TOPICAL_OINTMENT | Freq: Two times a day (BID) | CUTANEOUS | 0 refills | Status: DC

## 2019-12-13 MED ORDER — BACITRACIN ZINC 500 UNIT/GM EX OINT
TOPICAL_OINTMENT | Freq: Two times a day (BID) | CUTANEOUS | Status: DC
  Filled 2019-12-13: qty 0.9

## 2019-12-13 MED ORDER — BUPIVACAINE HCL (PF) 0.5 % IJ SOLN
20.0000 mL | Freq: Once | INTRAMUSCULAR | Status: AC
  Administered 2019-12-13: 20 mL
  Filled 2019-12-13: qty 20

## 2019-12-13 MED ORDER — NAPROXEN 500 MG PO TABS: 500.0000 mg | ORAL_TABLET | Freq: Two times a day (BID) | ORAL | 0 refills | Status: DC

## 2019-12-13 MED ORDER — FENTANYL CITRATE (PF) 100 MCG/2ML IJ SOLN
50.0000 ug | Freq: Once | INTRAMUSCULAR | Status: AC
  Administered 2019-12-13: 50 ug via INTRAVENOUS
  Filled 2019-12-13: qty 2

## 2019-12-13 MED ORDER — TETANUS-DIPHTH-ACELL PERTUSSIS 5-2.5-18.5 LF-MCG/0.5 IM SUSP
0.5000 mL | Freq: Once | INTRAMUSCULAR | Status: AC
  Administered 2019-12-13: 0.5 mL via INTRAMUSCULAR
  Filled 2019-12-13: qty 0.5

## 2019-12-13 MED ORDER — HYDROCODONE-ACETAMINOPHEN 5-325 MG PO TABS
1.0000 | ORAL_TABLET | Freq: Once | ORAL | Status: AC
  Administered 2019-12-13: 1 via ORAL
  Filled 2019-12-13: qty 1

## 2019-12-13 NOTE — ED Notes (Signed)
 of Fentanyl was given to the pt for emergency pain relief. Another RN was not available at that time. of Fentanyl  was not wasted in the pyxus for this patient.

## 2019-12-13 NOTE — ED Notes (Signed)
Pt refusing CT scans. MD notified. Pt belligerent and using profanity towards staff members. Security notified and at bedside.

## 2019-12-13 NOTE — ED Triage Notes (Signed)
Pt fell off roof and grabbed gutter and cut left fingers. Per EMS, exposed bone, bleeding controlled. ETOH +. Denies any other injuries, denies LOC, denies head/neck pain.

## 2019-12-13 NOTE — Discharge Instructions (Signed)
We saw you in the ER for your WOUND. °Please read the instructions provided on wound care. °Keep the area clean and dry, apply bacitracin ointment daily and take the medications provided. °RETURN TO THE ER IF THERE IS INCREASED PAIN, REDNESS, PUS COMING OUT from the wound site.  °

## 2019-12-13 NOTE — ED Notes (Signed)
Patient verbalizes understanding of discharge instructions. Opportunity for questioning and answers were provided. Armband removed by staff, pt discharged from ED ambulatory.   

## 2019-12-13 NOTE — ED Provider Notes (Signed)
MSE was initiated and I personally evaluated the patient and placed orders (if any) at  3:25 PM on December 13, 2019.  The patient appears stable so that the remainder of the MSE may be completed by another provider.   Patient presents via EMS after reportedly slipping and falling from a two-story roof.  He states he grabbed the gutter and the flashing to try to prevent falling and cut his left hand. He states he landed upright and on his feet. He denies head injury, neck/back pain, numbness, weakness, other pain in the left upper extremity, lower extremity pain, loss of consciousness, nausea/vomiting, chest pain, shortness of breath, abdominal pain.   A limited physical exam was able to be performed.  Patient would become quite upset when exam was attempted.  He yelled and cursed at me and staff members here in the ED.  When I tried to obtain more information and assess for possible other injuries, he became even more upset. He yells, "You guys are fucking unbelievable. My hand is hurting me and you guys are asking about me hitting my head and some fucking neck and back pain. Stupid shit like that. 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 I landed on my feet. My hand is what's hurt. Look at that!"  He would not allow further physical exam at the time I evaluated him.  His movements were noted to be purposeful.  He moves all of his extremities.  He freely moves his neck.  There is no noted facial droop, eye ptosis, or other facial abnormalities or obvious injuries.  His speech, though rough and unkind, was intact and I did not note any obvious cognitive deficits.  He was able to recount the events of his injury.  He has no noted tachypnea or increased work of breathing. No pallor or diaphoresis.  Lacerations noted to second through fifth fingers of the left hand. Capillary refill less than 2 seconds in each finger.  Sensation to light touch grossly intact in the fingers and hand  on the left. It appears as though he has difficulty with flexion in the left ring finger, but flexion and extension are intact in the other fingers on left hand.   Any information I was able to obtain was passed on to oncoming provider, Rhea Bleacher, PA-C.   Anselm Pancoast, PA-C 12/13/19 1626    Linwood Dibbles, MD 12/14/19 [phone]

## 2019-12-13 NOTE — ED Notes (Signed)
Pt caught smoking cigarette in room. Pt refused to give up cigarettes/lighter. Security at bedside.

## 2019-12-13 NOTE — ED Provider Notes (Signed)
[facility] Provider Note   CSN: [id number] Arrival date & time: 12/13/19  1513     History Chief Complaint  Patient presents with  . Extremity Laceration    Michael Cordova is a 39 y.o. male.  HPI      39 year old male with history of alcoholism, mood disorder comes in a chief complaint of fall. Patient admits to drinking and admits to alcoholism.  He reports that he was on a roof doing his job and lost his balance as the root was rotten.  In the process he tried to hang onto something and cut his hand.  He reports that he fell from about 15 feet height.  He is having no pain anywhere besides his left hand.  Patient is right-handed dominant.  Patient is belligerent at arrival.  He is unsure of his tetanus status but reports that he just left the prison.  Patient denies loss of consciousness, headaches, neck pain, focal numbness, weakness that is new, vision change or vomiting.  Past Medical History:  Diagnosis Date  . Alcohol abuse   . Bipolar 2 disorder (HCC)   . Manic depression (HCC)   . PTSD (post-traumatic stress disorder)     Patient Active Problem List   Diagnosis Date Noted  . Mood disorder in conditions classified elsewhere   . Alcohol use disorder, severe, dependence (HCC) 08/29/2016    Past Surgical History:  Procedure Laterality Date  . APPENDECTOMY         No family history on file.  Social History   Tobacco Use  . Smoking status: Current Every Day Smoker    Packs/day: 1.50    Years: 7.00    Pack years: 10.50    Types: Cigarettes  . Smokeless tobacco: Never Used  Substance Use Topics  . Alcohol use: Yes    Comment: drinks 2- fifths of liquor daily  . Drug use: Yes    Home Medications Prior to Admission medications   Medication Sig Start Date End Date Taking? Authorizing Provider  acamprosate (CAMPRAL) 333 MG tablet Take 2 tablets (666 mg total) by mouth 3 (three) times daily with meals. 09/04/16    Withrow, Everardo AllJohn C, FNP  acetaminophen (TYLENOL 8 HOUR) 650 MG CR tablet Take 1 tablet (650 mg total) by mouth every 8 (eight) hours as needed. 05/11/19   Derwood KaplanNanavati, Desean Heemstra, MD  albuterol (PROVENTIL HFA;VENTOLIN HFA) 108 (90 Base) MCG/ACT inhaler Inhale 1-2 puffs into the lungs every 4 (four) hours as needed for wheezing or shortness of breath. 09/04/16   Withrow, Everardo AllJohn C, FNP  cephALEXin (KEFLEX) 250 MG capsule Take 1 capsule (250 mg total) by mouth 4 (four) times daily. 09/20/16   Geoffery Lyonselo, Douglas, MD  ciprofloxacin-dexamethasone (CIPRODEX) OTIC suspension Place 4 drops into the right ear 2 (two) times daily. 05/11/19   Derwood KaplanNanavati, Joesphine Schemm, MD  citalopram (CELEXA) 20 MG tablet Take 1 tablet (20 mg total) by mouth daily. 09/05/16   Withrow, Everardo AllJohn C, FNP  doxycycline (VIBRAMYCIN) 100 MG capsule Take 1 capsule (100 mg total) by mouth 2 (two) times daily. 12/13/19   Derwood KaplanNanavati, Kael Keetch, MD  gabapentin (NEURONTIN) 300 MG capsule Take 1 capsule (300 mg total) by mouth 3 (three) times daily. 09/04/16   Withrow, Everardo AllJohn C, FNP  hydrOXYzine (ATARAX/VISTARIL) 25 MG tablet Take 1 tablet (25 mg total) by mouth every 6 (six) hours as needed for anxiety. 09/04/16   Withrow, Everardo AllJohn C, FNP  naproxen (NAPROSYN) 500 MG tablet Take 1 tablet (500  mg total) by mouth 2 (two) times daily. 12/13/19   Varney Biles, MD  neomycin-bacitracin-polymyxin (NEOSPORIN) ointment Apply 1 application topically every 12 (twelve) hours. 12/13/19   Varney Biles, MD  nicotine polacrilex (NICORETTE) 2 MG gum Take 1 each (2 mg total) by mouth as needed for smoking cessation. 09/04/16   Withrow, Elyse Jarvis, FNP  tamsulosin (FLOMAX) 0.4 MG CAPS capsule Take 1 capsule (0.4 mg total) by mouth daily. 05/11/19   Varney Biles, MD  thiamine 100 MG tablet Take 1 tablet (100 mg total) by mouth daily. 09/05/16   Withrow, Elyse Jarvis, FNP  traZODone (DESYREL) 50 MG tablet Take 1 tablet (50 mg total) by mouth at bedtime. 09/04/16   Withrow, Elyse Jarvis, FNP    Allergies     Penicillins  Review of Systems   Review of Systems  Constitutional: Positive for activity change.  Respiratory: Negative for shortness of breath.   Cardiovascular: Negative for chest pain.  Gastrointestinal: Negative for abdominal pain.  Musculoskeletal: Negative for back pain and neck pain.  Skin: Positive for rash and wound.  Neurological: Negative for headaches.    Physical Exam Updated Vital Signs BP 104/72   Pulse 82   Temp 97.9 F (36.6 C)   Resp 16   SpO2 98%   Physical Exam Vitals and nursing note reviewed.  Constitutional:      Appearance: He is well-developed.  HENT:     Head: Atraumatic.  Cardiovascular:     Rate and Rhythm: Normal rate.  Pulmonary:     Effort: Pulmonary effort is normal.  Musculoskeletal:     Cervical back: Neck supple.     Comments: Patient has laceration to digits two through five in his left hand on the palmar side.  The wound is essentially a flap type laceration and there is some hyperpigmentation of the skin towards the edges.  Bone is not exposed, underlying soft tissue is exposed when the flap is moved.  Patient able to flex over the IP joints.  He is not cooperating with individual joint evaluation but is able to make a fist.   Head to toe evaluation shows no hematoma, bleeding of the scalp, no facial abrasions, no spine step offs, crepitus of the chest or neck, no tenderness to palpation of the bilateral upper and lower extremities, no gross deformities, no chest tenderness, no pelvic pain.   Skin:    General: Skin is warm.  Neurological:     Mental Status: He is alert and oriented to person, place, and time.     Comments: Gross sensory exam of the pads of his digits is normal.           ED Results / Procedures / Treatments   Labs (all labs ordered are listed, but only abnormal results are displayed) Labs Reviewed  COMPREHENSIVE METABOLIC PANEL - Abnormal; Notable for the following components:      Result Value    Glucose, Bld 102 (*)    Calcium 8.7 (*)    All other components within normal limits  CBC - Abnormal; Notable for the following components:   RBC 4.07 (*)    Hemoglobin 12.9 (*)    HCT 38.7 (*)    All other components within normal limits  ETHANOL - Abnormal; Notable for the following components:   Alcohol, Ethyl (B) 221 (*)    All other components within normal limits  LACTIC ACID, PLASMA - Abnormal; Notable for the following components:   Lactic Acid, Venous 2.6 (*)  All other components within normal limits  PROTIME-INR  URINALYSIS, ROUTINE W REFLEX MICROSCOPIC  SAMPLE TO BLOOD BANK    EKG None  Radiology DG Hand Complete Left  Result Date: 12/13/2019 CLINICAL DATA:  Multiple lacerations EXAM: LEFT HAND - COMPLETE 3+ VIEW COMPARISON:  None. FINDINGS: Soft tissue lacerations noted within the fingers. Small linear radiopaque foreign body appears to be present within the middle finger soft tissues anterior to the DIP joint. No fracture, subluxation or dislocation. IMPRESSION: Soft tissue lacerations with possible small radiopaque foreign body within the middle finger soft tissues anterior to the DIP joint. No fracture. Electronically Signed   By: Charlett Nose M.D.   On: 12/13/2019 17:05    Procedures .Foreign Body Removal  Date/Time: 12/13/2019 6:33 PM Performed by: Derwood Kaplan, MD Authorized by: Derwood Kaplan, MD  Consent: Verbal consent obtained. Risks and benefits: risks, benefits and alternatives were discussed Consent given by: patient Patient understanding: patient states understanding of the procedure being performed Patient identity confirmed: arm band Time out: Immediately prior to procedure a "time out" was called to verify the correct patient, procedure, equipment, support staff and site/side marked as required. Anesthesia: digital block  Anesthesia: Local Anesthetic: bupivacaine 0.5% with epinephrine Anesthetic total: 8 mL  Sedation: Patient sedated:  no  Complexity: simple 2 objects recovered. Objects recovered: rings Post-procedure assessment: foreign body removed .Nerve Block  Date/Time: 12/13/2019 6:34 PM Performed by: Derwood Kaplan, MD Authorized by: Derwood Kaplan, MD   Consent:    Consent obtained:  Verbal   Consent given by:  Patient   Risks discussed:  Bleeding, pain, unsuccessful block, infection and nerve damage   Alternatives discussed:  No treatment Universal protocol:    Procedure explained and questions answered to patient or proxy's satisfaction: yes     Time out called: yes     Patient identity confirmed:  Arm band Location:    Body area:  Upper extremity   Upper extremity nerve blocked: DIGITAL - DIGIT #2.   Laterality:  Left Pre-procedure details:    Skin preparation:  Alcohol   Preparation: Patient was prepped and draped in usual sterile fashion   Procedure details (see MAR for exact dosages):    Block needle gauge:  25 G   Anesthetic injected:  Bupivacaine 0.5% WITH epi   Injection procedure:  Anatomic landmarks identified Post-procedure details:    Outcome:  Anesthesia achieved   Patient tolerance of procedure:  Tolerated well, no immediate complications .Nerve Block  Date/Time: 12/13/2019 6:36 PM Performed by: Derwood Kaplan, MD Authorized by: Derwood Kaplan, MD   Consent:    Consent obtained:  Verbal   Consent given by:  Patient   Risks discussed:  Infection, nerve damage, swelling, pain, bleeding and unsuccessful block Universal protocol:    Procedure explained and questions answered to patient or proxy's satisfaction: yes     Time out called: yes     Patient identity confirmed:  Arm band Indications:    Indications:  Procedural anesthesia and pain relief Location:    Body area:  Upper extremity   Upper extremity nerve blocked: DIGITAL - DIGIT #3.   Laterality:  Left Procedure details (see MAR for exact dosages):    Block needle gauge:  25 G   Anesthetic injected:  Bupivacaine 0.5%  WITH epi   Injection procedure:  Anatomic landmarks identified Post-procedure details:    Outcome:  Anesthesia achieved   Patient tolerance of procedure:  Tolerated well, no immediate complications .Nerve Block  Date/Time: 12/13/2019  6:37 PM Performed by: Derwood Kaplan, MD Authorized by: Derwood Kaplan, MD   Consent:    Consent obtained:  Verbal   Consent given by:  Patient   Risks discussed:  Nerve damage, infection, swelling, pain and unsuccessful block   Alternatives discussed:  No treatment Universal protocol:    Procedure explained and questions answered to patient or proxy's satisfaction: yes     Time out called: yes     Patient identity confirmed:  Arm band Indications:    Indications:  Procedural anesthesia and pain relief Location:    Body area:  Upper extremity   Upper extremity nerve blocked: DIGITAL - DIGIT # 4.   Laterality:  Left Pre-procedure details:    Skin preparation:  2% chlorhexidine   Preparation: Patient was prepped and draped in usual sterile fashion   .Nerve Block  Date/Time: 12/13/2019 6:41 PM Performed by: Derwood Kaplan, MD Authorized by: Derwood Kaplan, MD   Consent:    Consent obtained:  Verbal   Consent given by:  Patient   Risks discussed:  Infection, swelling, unsuccessful block, pain and bleeding   Alternatives discussed:  No treatment Universal protocol:    Procedure explained and questions answered to patient or proxy's satisfaction: yes     Time out called: yes     Patient identity confirmed:  Arm band Indications:    Indications:  Procedural anesthesia Location:    Body area:  Upper extremity   Upper extremity nerve blocked: DIGITAL - DIGIT #5.   Laterality:  Left Pre-procedure details:    Skin preparation:  Alcohol   Preparation: Patient was prepped and draped in usual sterile fashion   Skin anesthesia (see MAR for exact dosages):    Skin anesthesia method:  None Procedure details (see MAR for exact dosages):    Block  needle gauge:  25 G   Anesthetic injected:  Bupivacaine 0.5% WITH epi   Injection procedure:  Anatomic landmarks identified Post-procedure details:    Outcome:  Anesthesia achieved   Patient tolerance of procedure:  Tolerated well, no immediate complications .Marland KitchenLaceration Repair  Date/Time: 12/13/2019 6:42 PM Performed by: Derwood Kaplan, MD Authorized by: Derwood Kaplan, MD   Consent:    Consent obtained:  Verbal   Consent given by:  Patient   Risks discussed:  Infection, pain, poor cosmetic result, poor wound healing and need for additional repair   Alternatives discussed:  No treatment Universal protocol:    Procedure explained and questions answered to patient or proxy's satisfaction: yes     Immediately prior to procedure, a time out was called: yes     Patient identity confirmed:  Arm band Laceration details:    Location:  Finger   Finger location:  L long finger (LEFT DIGIT 2-5)   Length (cm):  22   Depth (mm):  5 Repair type:    Repair type:  Complex Pre-procedure details:    Preparation:  Patient was prepped and draped in usual sterile fashion and imaging obtained to evaluate for foreign bodies Exploration:    Limited defect created (wound extended): no     Hemostasis achieved with:  Direct pressure   Wound exploration: wound explored through full range of motion and entire depth of wound probed and visualized     Wound extent: fascia violated     Contaminated: yes   Treatment:    Area cleansed with:  Saline and soap and water   Amount of cleaning:  Extensive   Irrigation solution:  Sterile water  Irrigation volume:  100   Irrigation method:  Syringe   Visualized foreign bodies/material removed: yes     Debridement:  Minimal   Undermining:  Minimal   Scar revision: no   Skin repair:    Repair method:  Sutures   Suture size:  5-0   Suture material:  Nylon   Suture technique:  Simple interrupted   Number of sutures:  24 Approximation:    Approximation:   Loose Post-procedure details:    Dressing:  Antibiotic ointment, adhesive bandage and sterile dressing   Patient tolerance of procedure:  Tolerated well, no immediate complications Comments:     Patient had laceration to digit 2, 3, 4 and 5. Total length 22 to 24 cm A total of 20 4 sutures were placed. 4-0 and 5-0 nylons were used.   (including critical care time)  Medications Ordered in ED Medications  nicotine (NICODERM CQ - dosed in mg/24 hours) patch 21 mg (21 mg Transdermal Patch Applied 12/13/19 1729)  bacitracin ointment (has no administration in time range)  fentaNYL (SUBLIMAZE) injection 50 mcg (50 mcg Intravenous Given 12/13/19 1624)  ondansetron (ZOFRAN) injection 4 mg (4 mg Intravenous Given 12/13/19 1543)  Tdap (BOOSTRIX) injection 0.5 mL (0.5 mLs Intramuscular Given 12/13/19 1544)  bupivacaine (MARCAINE) 0.5 % injection 20 mL (20 mLs Infiltration Given 12/13/19 1650)  sodium chloride 0.9 % bolus 1,000 mL (0 mLs Intravenous Stopped 12/13/19 1826)  doxycycline (VIBRA-TABS) tablet 100 mg (100 mg Oral Given 12/13/19 1830)  HYDROcodone-acetaminophen (NORCO/VICODIN) 5-325 MG per tablet 1 tablet (1 tablet Oral Given 12/13/19 1830)    ED Course  I have reviewed the triage vital signs and the nursing notes.  Pertinent labs & imaging results that were available during my care of the patient were reviewed by me and considered in my medical decision making (see chart for details).    MDM Rules/Calculators/A&P                      Patient comes in a chief complaint of fall. He reports that he fell from about 15 feet height.  He is noted to have bleeding from his hand.  Rings, foreign body removed.  All digits numbed using digital nerve block.. Wound extensively washed.  Patient has macerated wound, complex.  Repair achieved.  Wound cleaned again after the repair and antibiotic ointment applied.  Patient advised to return to the ER in 7 days for suture removal. He needs to come back sooner if  he starts having any signs of infection.   Patient declined CT scans against medical advice. Patient understands that his actions will lead to inadequate medical workup, and that he/she is at risk of complications of missed diagnosis, which includes morbidity and mortality.  Alternative options discussed - CT after the hand repaid, ER observation. Opportunity to change mind given. Discussion witnessed by APP, RN. Patient is intoxicated but demonstrating good capacity to make decision. Patient understands that he needs to return to the ER immediately if his symptoms get worse.    Final Clinical Impression(s) / ED Diagnoses Final diagnoses:  Fall, initial encounter  Laceration of right hand without foreign body, initial encounter  Multiple contusions    Rx / DC Orders ED Discharge Orders         Ordered    doxycycline (VIBRAMYCIN) 100 MG capsule  2 times daily     12/13/19 1833    naproxen (NAPROSYN) 500 MG tablet  2 times daily  12/13/19 1833    neomycin-bacitracin-polymyxin (NEOSPORIN) ointment  Every 12 hours     12/13/19 1833           Derwood Kaplan, MD 12/13/19 [phone]

## 2019-12-13 NOTE — ED Notes (Signed)
Pt hand soaking.

## 2019-12-13 NOTE — ED Notes (Signed)
Sutures completed by MD. No active bleeding. Pt's hand cleaned with warm soap and water. Bacitracin applied, with nonadherent gauze and kurlex. Pt verbalized understanding of infection prevention and pt provided with multiple spare dressings for regular dressing changes.

## 2019-12-23 ENCOUNTER — Emergency Department (HOSPITAL_COMMUNITY)
Admission: EM | Admit: 2019-12-23 | Discharge: 2019-12-23 | Disposition: A | Discharge status: Home / Self Care | Payer: Self-pay | Attending: Emergency Medicine | Admitting: Emergency Medicine

## 2019-12-23 ENCOUNTER — Emergency Department (HOSPITAL_COMMUNITY): Payer: Self-pay

## 2019-12-23 ENCOUNTER — Other Ambulatory Visit: Payer: Self-pay

## 2019-12-23 ENCOUNTER — Emergency Department (HOSPITAL_COMMUNITY)
Admission: EM | Admit: 2019-12-23 | Discharge: 2019-12-23 | Discharge status: Against Medical Advice | Payer: Self-pay | Attending: Emergency Medicine | Admitting: Emergency Medicine

## 2019-12-23 ENCOUNTER — Encounter (HOSPITAL_COMMUNITY): Payer: Self-pay | Admitting: Emergency Medicine

## 2019-12-23 DIAGNOSIS — F319 Bipolar disorder, unspecified: Secondary | ICD-10-CM | POA: Insufficient documentation

## 2019-12-23 DIAGNOSIS — L089 Local infection of the skin and subcutaneous tissue, unspecified: Secondary | ICD-10-CM | POA: Insufficient documentation

## 2019-12-23 DIAGNOSIS — Z4802 Encounter for removal of sutures: Secondary | ICD-10-CM

## 2019-12-23 DIAGNOSIS — Z79899 Other long term (current) drug therapy: Secondary | ICD-10-CM | POA: Insufficient documentation

## 2019-12-23 DIAGNOSIS — F17210 Nicotine dependence, cigarettes, uncomplicated: Secondary | ICD-10-CM | POA: Insufficient documentation

## 2019-12-23 DIAGNOSIS — Z5321 Procedure and treatment not carried out due to patient leaving prior to being seen by health care provider: Secondary | ICD-10-CM | POA: Insufficient documentation

## 2019-12-23 LAB — CBC
HCT: 39.8 % (ref 39.0–52.0)
Hemoglobin: 13.4 g/dL (ref 13.0–17.0)
MCH: 32.7 pg (ref 26.0–34.0)
MCHC: 33.7 g/dL (ref 30.0–36.0)
MCV: 97.1 fL (ref 80.0–100.0)
Platelets: 322 10*3/uL (ref 150–400)
RBC: 4.1 MIL/uL — ABNORMAL LOW (ref 4.22–5.81)
RDW: 13.6 % (ref 11.5–15.5)
WBC: 12.5 10*3/uL — ABNORMAL HIGH (ref 4.0–10.5)
nRBC: 0 % (ref 0.0–0.2)

## 2019-12-23 LAB — BASIC METABOLIC PANEL
Anion gap: 12 (ref 5–15)
BUN: 5 mg/dL — ABNORMAL LOW (ref 6–20)
CO2: 25 mmol/L (ref 22–32)
Calcium: 8.7 mg/dL — ABNORMAL LOW (ref 8.9–10.3)
Chloride: 101 mmol/L (ref 98–111)
Creatinine, Ser: 0.81 mg/dL (ref 0.61–1.24)
GFR calc Af Amer: >60 mL/min (ref >60)
GFR calc non Af Amer: >60 mL/min (ref >60)
Glucose, Bld: 104 mg/dL — ABNORMAL HIGH (ref 70–99)
Potassium: 3.9 mmol/L (ref 3.5–5.1)
Sodium: 138 mmol/L (ref 135–145)

## 2019-12-23 LAB — CBC WITH DIFFERENTIAL/PLATELET
Abs Immature Granulocytes: 0.1 10*3/uL — ABNORMAL HIGH (ref 0.00–0.07)
Basophils Absolute: 0.2 10*3/uL — ABNORMAL HIGH (ref 0.0–0.1)
Basophils Relative: 1 %
Eosinophils Absolute: 0.2 10*3/uL (ref 0.0–0.5)
Eosinophils Relative: 2 %
HCT: 40.4 % (ref 39.0–52.0)
Hemoglobin: 13.5 g/dL (ref 13.0–17.0)
Immature Granulocytes: 1 %
Lymphocytes Relative: 19 %
Lymphs Abs: 2.2 10*3/uL (ref 0.7–4.0)
MCH: 32.2 pg (ref 26.0–34.0)
MCHC: 33.4 g/dL (ref 30.0–36.0)
MCV: 96.4 fL (ref 80.0–100.0)
Monocytes Absolute: 1 10*3/uL (ref 0.1–1.0)
Monocytes Relative: 8 %
Neutro Abs: 7.8 10*3/uL — ABNORMAL HIGH (ref 1.7–7.7)
Neutrophils Relative %: 69 %
Platelets: 325 10*3/uL (ref 150–400)
RBC: 4.19 MIL/uL — ABNORMAL LOW (ref 4.22–5.81)
RDW: 13.7 % (ref 11.5–15.5)
WBC: 11.4 10*3/uL — ABNORMAL HIGH (ref 4.0–10.5)
nRBC: 0 % (ref 0.0–0.2)

## 2019-12-23 LAB — COMPREHENSIVE METABOLIC PANEL
ALT: 23 U/L (ref 0–44)
AST: 38 U/L (ref 15–41)
Albumin: 3.9 g/dL (ref 3.5–5.0)
Alkaline Phosphatase: 80 U/L (ref 38–126)
Anion gap: 14 (ref 5–15)
BUN: <5 mg/dL — ABNORMAL LOW (ref 6–20)
CO2: 24 mmol/L (ref 22–32)
Calcium: 9.1 mg/dL (ref 8.9–10.3)
Chloride: 102 mmol/L (ref 98–111)
Creatinine, Ser: 0.85 mg/dL (ref 0.61–1.24)
GFR calc Af Amer: >60 mL/min (ref >60)
GFR calc non Af Amer: >60 mL/min (ref >60)
Glucose, Bld: 96 mg/dL (ref 70–99)
Potassium: 4.1 mmol/L (ref 3.5–5.1)
Sodium: 140 mmol/L (ref 135–145)
Total Bilirubin: 0.4 mg/dL (ref 0.3–1.2)
Total Protein: 7.2 g/dL (ref 6.5–8.1)

## 2019-12-23 LAB — LACTIC ACID, PLASMA: Lactic Acid, Venous: 1.5 mmol/L (ref 0.5–1.9)

## 2019-12-23 MED ORDER — BACITRACIN-NEOMYCIN-POLYMYXIN 400-5-5000 EX OINT: 1.0000 "application " | TOPICAL_OINTMENT | Freq: Two times a day (BID) | CUTANEOUS | 0 refills | Status: DC

## 2019-12-23 MED ORDER — ETODOLAC 300 MG PO CAPS
300.0000 mg | ORAL_CAPSULE | Freq: Once | ORAL | Status: AC
  Administered 2019-12-23: 300 mg via ORAL
  Filled 2019-12-23: qty 1

## 2019-12-23 MED ORDER — DOXYCYCLINE HYCLATE 100 MG PO CAPS: 100.0000 mg | ORAL_CAPSULE | Freq: Two times a day (BID) | ORAL | 0 refills | Status: DC

## 2019-12-23 MED ORDER — NAPROXEN 500 MG PO TABS: 500.0000 mg | ORAL_TABLET | Freq: Two times a day (BID) | ORAL | 0 refills | Status: DC

## 2019-12-23 MED ORDER — NICOTINE 21 MG/24HR TD PT24
21.0000 mg | MEDICATED_PATCH | Freq: Once | TRANSDERMAL | Status: DC
  Administered 2019-12-23: 21 mg via TRANSDERMAL
  Filled 2019-12-23: qty 1

## 2019-12-23 MED ORDER — ACETAMINOPHEN 325 MG PO TABS
650.0000 mg | ORAL_TABLET | Freq: Once | ORAL | Status: DC
  Filled 2019-12-23: qty 2

## 2019-12-23 MED ORDER — MORPHINE SULFATE (PF) 4 MG/ML IV SOLN
4.0000 mg | Freq: Once | INTRAVENOUS | Status: AC
  Administered 2019-12-23: 4 mg via INTRAVENOUS
  Filled 2019-12-23: qty 1

## 2019-12-23 MED ORDER — CLINDAMYCIN PHOSPHATE 600 MG/50ML IV SOLN
600.0000 mg | Freq: Once | INTRAVENOUS | Status: AC
  Administered 2019-12-23: 18:00:00 600 mg via INTRAVENOUS
  Filled 2019-12-23: qty 50

## 2019-12-23 NOTE — ED Notes (Signed)
Pt continues to go outside to scream.

## 2019-12-23 NOTE — Care Management (Addendum)
ED CM consulted for medication assistance, patient is uninsured. MATCH letter faxed to [facility] on [address]   MATCH Medication Assistance Card Name: Alexandros Ewan ID (MRN): [id number] [facility]: [id number] RX Group: [id number] Discharge Date: 12/23/2019 Expiration Date: 01/06/2020                                           (must be filled within 7 days of discharge)      Dear   :  Cyril Loosen  You have been approved to have the prescriptions written by your discharging physician filled through our [facility] (Medication Assistance Through [facility]) program. This program allows for a one-time (no refills) 34-day supply of selected medications for a low copay amount.  The copay is $3.00 per prescription. For instance, if you have one prescription, you will pay $3.00; for two prescriptions, you pay $6.00; for three prescriptions, you pay $9.00; and so on.  Only certain pharmacies are participating in this program with [facility]. You will need to select one of the pharmacies from the attached list and take your prescriptions, this letter, and your photo ID to one of the participating pharmacies.   We are excited that you are able to use the [facility] program to get your medications. These prescriptions must be filled within 7 days of hospital discharge or they will no longer be valid for the [facility] program. Should you have any problems with your prescriptions please contact your case management team member at [phone] for [place]/ [facility].  Thank you, [facility] Care Management

## 2019-12-23 NOTE — ED Triage Notes (Signed)
Pt checked back in for hand evaluation. States he is going to buy drugs across the street and then will be back.
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
## 2019-12-23 NOTE — Discharge Instructions (Addendum)
As discussed, I am sending you home with antibiotics.  Take as prescribed.  I am also sending home with pain medication.  You may take twice a day as needed for pain.  I have included the number for the hand surgeon.  Please call tomorrow for further evaluation.  Please keep area clean.  Return to the ER for new or worsening symptoms.

## 2019-12-24 ENCOUNTER — Telehealth: Payer: Self-pay | Admitting: *Deleted

## 2019-12-24 NOTE — Telephone Encounter (Signed)
Correction to previous entry..Cardiology number was entered correctly in [facility] system, but incorrectly on card.  RNCM faxed correct card to pharmacy.

## 2019-12-24 NOTE — Telephone Encounter (Signed)
Pharmacy called regarding MATCH card not going through.  RNCM reviewed entry in system and found it entered correctly.  RNCM faxed card to pharmacy as requested.

## 2019-12-24 NOTE — Telephone Encounter (Signed)
  MATCH Medication Assistance Card Name: Jadarion Halbig ID (MRN): [id number] Johnney Killian: [id number] RX Group: [id number] Discharge Date: 12/23/2019 Expiration Date:12/31/2019                                           (must be filled within 7 days of discharge)     You have been approved to have the prescriptions written by your discharging physician filled through our [facility] (Medication Assistance Through [facility]) program. This program allows for a one-time (no refills) 34-day supply of selected medications for a low copay amount.  The copay is $3.00 per prescription. For instance, if you have one prescription, you will pay $3.00; for two prescriptions, you pay $6.00; for three prescriptions, you pay $9.00; and so on.  Only certain pharmacies are participating in this program with [facility]. You will need to select one of the pharmacies from the attached list and take your prescriptions, this letter, and your photo ID to one of the participating pharmacies.   We are excited that you are able to use the [facility] program to get your medications. These prescriptions must be filled within 7 days of hospital discharge or they will no longer be valid for the [facility] program. Should you have any problems with your prescriptions please contact your case management team member at [phone] for [place]/ [facility].  Thank you, [facility] Health Care Management

## 2020-01-17 ENCOUNTER — Other Ambulatory Visit: Payer: Self-pay

## 2020-01-17 ENCOUNTER — Emergency Department (HOSPITAL_COMMUNITY)
Admission: EM | Admit: 2020-01-17 | Discharge: 2020-01-17 | Disposition: A | Discharge status: Home / Self Care | Payer: Self-pay | Attending: Emergency Medicine | Admitting: Emergency Medicine

## 2020-01-17 ENCOUNTER — Encounter (HOSPITAL_COMMUNITY): Payer: Self-pay

## 2020-01-17 DIAGNOSIS — Z789 Other specified health status: Secondary | ICD-10-CM

## 2020-01-17 DIAGNOSIS — Y999 Unspecified external cause status: Secondary | ICD-10-CM | POA: Insufficient documentation

## 2020-01-17 DIAGNOSIS — W57XXXA Bitten or stung by nonvenomous insect and other nonvenomous arthropods, initial encounter: Secondary | ICD-10-CM

## 2020-01-17 DIAGNOSIS — F17210 Nicotine dependence, cigarettes, uncomplicated: Secondary | ICD-10-CM | POA: Insufficient documentation

## 2020-01-17 DIAGNOSIS — Z5321 Procedure and treatment not carried out due to patient leaving prior to being seen by health care provider: Secondary | ICD-10-CM | POA: Insufficient documentation

## 2020-01-17 DIAGNOSIS — Y929 Unspecified place or not applicable: Secondary | ICD-10-CM | POA: Insufficient documentation

## 2020-01-17 DIAGNOSIS — S61412D Laceration without foreign body of left hand, subsequent encounter: Secondary | ICD-10-CM | POA: Insufficient documentation

## 2020-01-17 DIAGNOSIS — S61219S Laceration without foreign body of unspecified finger without damage to nail, sequela: Secondary | ICD-10-CM

## 2020-01-17 DIAGNOSIS — M79642 Pain in left hand: Secondary | ICD-10-CM | POA: Insufficient documentation

## 2020-01-17 DIAGNOSIS — F10929 Alcohol use, unspecified with intoxication, unspecified: Secondary | ICD-10-CM | POA: Insufficient documentation

## 2020-01-17 DIAGNOSIS — S70262A Insect bite (nonvenomous), left hip, initial encounter: Secondary | ICD-10-CM | POA: Insufficient documentation

## 2020-01-17 DIAGNOSIS — Y9389 Activity, other specified: Secondary | ICD-10-CM | POA: Insufficient documentation

## 2020-01-17 DIAGNOSIS — F1010 Alcohol abuse, uncomplicated: Secondary | ICD-10-CM | POA: Insufficient documentation

## 2020-01-17 DIAGNOSIS — F1090 Alcohol use, unspecified, uncomplicated: Secondary | ICD-10-CM

## 2020-01-17 DIAGNOSIS — M79645 Pain in left finger(s): Secondary | ICD-10-CM | POA: Insufficient documentation

## 2020-01-17 DIAGNOSIS — Z7289 Other problems related to lifestyle: Secondary | ICD-10-CM

## 2020-01-17 MED ORDER — BACITRACIN ZINC 500 UNIT/GM EX OINT
1.0000 "application " | TOPICAL_OINTMENT | Freq: Once | CUTANEOUS | Status: AC
  Administered 2020-01-17: 1 via TOPICAL

## 2020-01-17 MED ORDER — ETODOLAC 300 MG PO CAPS: 300.0000 mg | ORAL_CAPSULE | Freq: Three times a day (TID) | ORAL | 0 refills | Status: DC

## 2020-01-17 MED ORDER — DOXYCYCLINE HYCLATE 100 MG PO TABS: 100.0000 mg | ORAL_TABLET | Freq: Two times a day (BID) | ORAL | 0 refills | Status: DC

## 2020-01-17 MED ORDER — NAPROXEN 500 MG PO TABS
500.0000 mg | ORAL_TABLET | Freq: Once | ORAL | Status: AC
  Administered 2020-01-17: 500 mg via ORAL
  Filled 2020-01-17: qty 1

## 2020-01-17 MED ORDER — DOXYCYCLINE HYCLATE 100 MG PO TABS
100.0000 mg | ORAL_TABLET | Freq: Once | ORAL | Status: AC
  Administered 2020-01-17: 100 mg via ORAL
  Filled 2020-01-17: qty 1

## 2020-01-17 NOTE — ED Triage Notes (Signed)
PER EMS: Pt is coming from [employer] with c/o left hand pain. Patient had an injury to his hand three weeks ago which he had sutures placed. States he was suppose to see a Careers adviser but was unable to do so and now believes his fingers are infected. Pt has a tick bite on left hip. Pt admits to drinking 1/5 liquor, 4 4 lokos, and taking meth and crack.   EMS VITALS  BP 125/92 HR 76 SPO2 98% RA TEMP 97.2

## 2020-01-17 NOTE — ED Provider Notes (Signed)
[facility]-EMERGENCY DEPT Provider Note   CSN: [id number] Arrival date & time: 01/17/20  0734     History Chief Complaint  Patient presents with  . Hand Pain  . Insect Bite  . Alcohol Intoxication    Michael Cordova is a 39 y.o. male.  HPI   Patient states he is here for a couple of reasons.  Injured his hand in early April of this year.  He sustained several finger lacerations after trying to hang onto something.  Patient was seen in the emergency room and had his wounds repaired.  Patient followed up on April 14 in the emergency room.  Patient was evaluated for possible infection.  He was discharged home with antibiotics and instructed to follow-up with Dr. Orlan Leavens.  Patient states he was never able to do that because he does not have a phone and was not able to make a telephone call.  Patient states he still has swelling and discomfort in his fingers.  Patient did have x-rays on April 14 that did show questionable persistent foreign bodies in his second and third, and 4th digits.  Patient also complains of a tick bite on his hip.  He also is having some swelling and discomfort in his groin area from swollen lymph nodes.  He denies any fevers or chills.  Patient is also an alcoholic.  He admits to continued alcohol use.  He wants to try  Past Medical History:  Diagnosis Date  . Alcohol abuse   . Bipolar 2 disorder ([facility])   . Manic depression ([facility])   . PTSD (post-traumatic stress disorder)     Patient Active Problem List   Diagnosis Date Noted  . Mood disorder in conditions classified elsewhere   . Alcohol use disorder, severe, dependence ([facility]) 08/29/2016    Past Surgical History:  Procedure Laterality Date  . APPENDECTOMY         History reviewed. No pertinent family history.  Social History   Tobacco Use  . Smoking status: Current Every Day Smoker    Packs/day: 1.50    Years: 7.00    Pack years: 10.50    Types: Cigarettes  . Smokeless tobacco:  Never Used  Substance Use Topics  . Alcohol use: Yes    Comment: drinks 2- fifths of liquor daily  . Drug use: Yes    Home Medications Prior to Admission medications   Medication Sig Start Date End Date Taking? Authorizing Provider  acamprosate (CAMPRAL) 333 MG tablet Take 2 tablets (666 mg total) by mouth 3 (three) times daily with meals. Patient not taking: Reported on 12/23/2019 09/04/16   Beau Fanny, FNP  acetaminophen (TYLENOL 8 HOUR) 650 MG CR tablet Take 1 tablet (650 mg total) by mouth every 8 (eight) hours as needed. Patient not taking: Reported on 12/23/2019 05/11/19   Derwood Kaplan, MD  albuterol (PROVENTIL HFA;VENTOLIN HFA) 108 (90 Base) MCG/ACT inhaler Inhale 1-2 puffs into the lungs every 4 (four) hours as needed for wheezing or shortness of breath. Patient not taking: Reported on 12/23/2019 09/04/16   Withrow, Everardo All, FNP  cephALEXin (KEFLEX) 250 MG capsule Take 1 capsule (250 mg total) by mouth 4 (four) times daily. Patient not taking: Reported on 12/23/2019 09/20/16   Geoffery Lyons, MD  ciprofloxacin-dexamethasone (CIPRODEX) OTIC suspension Place 4 drops into the right ear 2 (two) times daily. 05/11/19   Derwood Kaplan, MD  citalopram (CELEXA) 20 MG tablet Take 1 tablet (20 mg total) by mouth daily. 09/05/16  Withrow, Everardo All, FNP  doxycycline (VIBRA-TABS) 100 MG tablet Take 1 tablet (100 mg total) by mouth 2 (two) times daily. 01/17/20   Linwood Dibbles, MD  etodolac (LODINE) 300 MG capsule Take 1 capsule (300 mg total) by mouth every 8 (eight) hours. 01/17/20   Linwood Dibbles, MD  gabapentin (NEURONTIN) 300 MG capsule Take 1 capsule (300 mg total) by mouth 3 (three) times daily. 09/04/16   Withrow, Everardo All, FNP  hydrOXYzine (ATARAX/VISTARIL) 25 MG tablet Take 1 tablet (25 mg total) by mouth every 6 (six) hours as needed for anxiety. 09/04/16   Withrow, Everardo All, FNP  naproxen (NAPROSYN) 500 MG tablet Take 1 tablet (500 mg total) by mouth 2 (two) times daily. 12/13/19   Derwood Kaplan, MD    naproxen (NAPROSYN) 500 MG tablet Take 1 tablet (500 mg total) by mouth 2 (two) times daily. 12/23/19   Mannie Stabile, PA-C  neomycin-bacitracin-polymyxin (NEOSPORIN) ointment Apply 1 application topically every 12 (twelve) hours. 12/13/19   Derwood Kaplan, MD  neomycin-bacitracin-polymyxin (NEOSPORIN) ointment Apply 1 application topically every 12 (twelve) hours. 12/23/19   Mannie Stabile, PA-C  nicotine polacrilex (NICORETTE) 2 MG gum Take 1 each (2 mg total) by mouth as needed for smoking cessation. 09/04/16   Withrow, Everardo All, FNP  tamsulosin (FLOMAX) 0.4 MG CAPS capsule Take 1 capsule (0.4 mg total) by mouth daily. 05/11/19   Derwood Kaplan, MD  thiamine 100 MG tablet Take 1 tablet (100 mg total) by mouth daily. 09/05/16   Withrow, Everardo All, FNP  traZODone (DESYREL) 50 MG tablet Take 1 tablet (50 mg total) by mouth at bedtime. 09/04/16   Withrow, Everardo All, FNP    Allergies    Penicillins  Review of Systems   Review of Systems  All other systems reviewed and are negative.   Physical Exam Updated Vital Signs BP (!) 139/94 (BP Location: Right Arm)   Pulse 93   Temp 97.6 F (36.4 C)   Resp 18   SpO2 94%   Physical Exam Vitals and nursing note reviewed.  Constitutional:      General: He is not in acute distress.    Appearance: He is well-developed.  HENT:     Head: Normocephalic and atraumatic.     Right Ear: External ear normal.     Left Ear: External ear normal.  Eyes:     General: No scleral icterus.       Right eye: No discharge.        Left eye: No discharge.     Conjunctiva/sclera: Conjunctivae normal.  Neck:     Trachea: No tracheal deviation.  Cardiovascular:     Rate and Rhythm: Normal rate and regular rhythm.  Pulmonary:     Effort: Pulmonary effort is normal. No respiratory distress.     Breath sounds: Normal breath sounds. No stridor. No wheezing or rales.  Abdominal:     General: Bowel sounds are normal. There is no distension.     Palpations:  Abdomen is soft.     Tenderness: There is no abdominal tenderness. There is no guarding or rebound.  Musculoskeletal:        General: No tenderness.     Cervical back: Neck supple.     Comments: Wounds left hand, appear to be healing by secondary intention.  No purulent drainage, no erythema, no purulent drainage,  Skin:    General: Skin is warm and dry.     Findings: No rash.  Comments: 3 mm scab lesion on left hip, small papule right hip, inguinal lymphadenopathy left groin  Neurological:     Mental Status: He is alert.     Cranial Nerves: No cranial nerve deficit (no facial droop, extraocular movements intact, no slurred speech).     Sensory: No sensory deficit.     Motor: No abnormal muscle tone or seizure activity.     Coordination: Coordination normal.     ED Results / Procedures / Treatments    Procedures Procedures (including critical care time)  Medications Ordered in ED Medications  naproxen (NAPROSYN) tablet 500 mg (500 mg Oral Given 01/17/20 0753)  doxycycline (VIBRA-TABS) tablet 100 mg (100 mg Oral Given 01/17/20 0753)  bacitracin ointment 1 application (1 application Topical Given 01/17/20 0754)    ED Course  I have reviewed the triage vital signs and the nursing notes.  Pertinent labs & imaging results that were available during my care of the patient were reviewed by me and considered in my medical decision making (see chart for details).    MDM Rules/Calculators/A&P                      Patient is having persistent pain associated with his finger injury.  No evidence of overt deep space infection.  Not sure that patient has been able to provide good wound care.  Recommend outpatient follow-up with orthopedics with concern of possible persistent foreign body.  NO need for emergent evaluation.  Will rx doxycycline for the tick bite and lymphadenitis.  No signs of DTs. Resource guide for alcohol use. Final Clinical Impression(s) / ED Diagnoses Final diagnoses:    Laceration of finger of left hand without damage to nail, foreign body presence unspecified, unspecified finger, sequela  Tick bite, initial encounter  Alcohol use    Rx / DC Orders ED Discharge Orders         Ordered    doxycycline (VIBRA-TABS) 100 MG tablet  2 times daily     01/17/20 0807    etodolac (LODINE) 300 MG capsule  Every 8 hours    Note to Pharmacy: As needed for pain   01/17/20 0807           Dorie Rank, MD 01/17/20 [phone]

## 2020-01-17 NOTE — ED Notes (Signed)
Pt ambulated to the restroom without needing assistance. 

## 2020-01-17 NOTE — ED Notes (Signed)
Pt provided sandwich, cheese stick, and beverage and water.

## 2020-01-17 NOTE — ED Notes (Signed)
Pt upset that he is in the hallway. Pt assured that we are providing the best possible care for him. Intermittently screaming at staff.

## 2020-01-17 NOTE — ED Notes (Signed)
Patient left and stated "Id rather die than stay here and hear all this shit." Pt was ambulatory upon exit, gait steady, no abnormalities detected.
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
## 2020-01-17 NOTE — Discharge Instructions (Signed)
Contact a treatment center to help you with your alcohol use.  Take the antibiotics for tick bite.  Follow up with the hand surgeon if able.  Try to apply antibiotic ointment to the wound daily

## 2020-02-05 ENCOUNTER — Inpatient Hospital Stay (HOSPITAL_COMMUNITY)
Admission: EM | Admit: 2020-02-05 | Discharge: 2020-02-10 | DRG: 494 | Discharge status: Against Medical Advice | Payer: No Typology Code available for payment source | Attending: Orthopaedic Surgery | Admitting: Orthopaedic Surgery

## 2020-02-05 ENCOUNTER — Emergency Department (HOSPITAL_COMMUNITY): Payer: No Typology Code available for payment source

## 2020-02-05 ENCOUNTER — Encounter (HOSPITAL_COMMUNITY): Payer: Self-pay | Admitting: Emergency Medicine

## 2020-02-05 DIAGNOSIS — Y908 Blood alcohol level of 240 mg/100 ml or more: Secondary | ICD-10-CM | POA: Diagnosis present

## 2020-02-05 DIAGNOSIS — M549 Dorsalgia, unspecified: Secondary | ICD-10-CM | POA: Diagnosis not present

## 2020-02-05 DIAGNOSIS — S82401A Unspecified fracture of shaft of right fibula, initial encounter for closed fracture: Secondary | ICD-10-CM

## 2020-02-05 DIAGNOSIS — S82261A Displaced segmental fracture of shaft of right tibia, initial encounter for closed fracture: Principal | ICD-10-CM | POA: Diagnosis present

## 2020-02-05 DIAGNOSIS — Z590 Homelessness: Secondary | ICD-10-CM

## 2020-02-05 DIAGNOSIS — S82251A Displaced comminuted fracture of shaft of right tibia, initial encounter for closed fracture: Secondary | ICD-10-CM | POA: Diagnosis present

## 2020-02-05 DIAGNOSIS — Z20822 Contact with and (suspected) exposure to covid-19: Secondary | ICD-10-CM | POA: Diagnosis present

## 2020-02-05 DIAGNOSIS — F17200 Nicotine dependence, unspecified, uncomplicated: Secondary | ICD-10-CM | POA: Diagnosis present

## 2020-02-05 DIAGNOSIS — Z5329 Procedure and treatment not carried out because of patient's decision for other reasons: Secondary | ICD-10-CM | POA: Diagnosis not present

## 2020-02-05 DIAGNOSIS — S82491A Other fracture of shaft of right fibula, initial encounter for closed fracture: Secondary | ICD-10-CM | POA: Diagnosis present

## 2020-02-05 DIAGNOSIS — Z09 Encounter for follow-up examination after completed treatment for conditions other than malignant neoplasm: Secondary | ICD-10-CM

## 2020-02-05 DIAGNOSIS — F10220 Alcohol dependence with intoxication, uncomplicated: Secondary | ICD-10-CM | POA: Diagnosis present

## 2020-02-05 DIAGNOSIS — T1490XA Injury, unspecified, initial encounter: Secondary | ICD-10-CM

## 2020-02-05 DIAGNOSIS — S82201A Unspecified fracture of shaft of right tibia, initial encounter for closed fracture: Secondary | ICD-10-CM

## 2020-02-05 DIAGNOSIS — F10920 Alcohol use, unspecified with intoxication, uncomplicated: Secondary | ICD-10-CM

## 2020-02-05 DIAGNOSIS — F1290 Cannabis use, unspecified, uncomplicated: Secondary | ICD-10-CM | POA: Diagnosis present

## 2020-02-05 DIAGNOSIS — Y92410 Unspecified street and highway as the place of occurrence of the external cause: Secondary | ICD-10-CM

## 2020-02-05 LAB — CBC
HCT: 40.6 % (ref 39.0–52.0)
Hemoglobin: 14.1 g/dL (ref 13.0–17.0)
MCH: 33.2 pg (ref 26.0–34.0)
MCHC: 34.7 g/dL (ref 30.0–36.0)
MCV: 95.5 fL (ref 80.0–100.0)
Platelets: 264 10*3/uL (ref 150–400)
RBC: 4.25 MIL/uL (ref 4.22–5.81)
RDW: 12.9 % (ref 11.5–15.5)
WBC: 11.7 10*3/uL — ABNORMAL HIGH (ref 4.0–10.5)
nRBC: 0 % (ref 0.0–0.2)

## 2020-02-05 LAB — I-STAT CHEM 8, ED
BUN: 12 mg/dL (ref 6–20)
Calcium, Ion: 1.02 mmol/L — ABNORMAL LOW (ref 1.15–1.40)
Chloride: 105 mmol/L (ref 98–111)
Creatinine, Ser: 1.4 mg/dL — ABNORMAL HIGH (ref 0.61–1.24)
Glucose, Bld: 125 mg/dL — ABNORMAL HIGH (ref 70–99)
HCT: 42 % (ref 39.0–52.0)
Hemoglobin: 14.3 g/dL (ref 13.0–17.0)
Potassium: 3.6 mmol/L (ref 3.5–5.1)
Sodium: 145 mmol/L (ref 135–145)
TCO2: 22 mmol/L (ref 22–32)

## 2020-02-05 LAB — PROTIME-INR
INR: 0.9 (ref 0.8–1.2)
Prothrombin Time: 12.2 seconds (ref 11.4–15.2)

## 2020-02-05 LAB — LACTIC ACID, PLASMA: Lactic Acid, Venous: 2.4 mmol/L (ref 0.5–1.9)

## 2020-02-05 LAB — SAMPLE TO BLOOD BANK

## 2020-02-05 LAB — ETHANOL: Alcohol, Ethyl (B): 355 mg/dL (ref <10)

## 2020-02-05 MED ORDER — ADULT MULTIVITAMIN W/MINERALS CH
1.0000 | ORAL_TABLET | Freq: Every day | ORAL | Status: DC
  Administered 2020-02-06 – 2020-02-10 (×5): 1 via ORAL
  Filled 2020-02-05 (×5): qty 1

## 2020-02-05 MED ORDER — IOHEXOL 300 MG/ML  SOLN
100.0000 mL | Freq: Once | INTRAMUSCULAR | Status: AC | PRN
  Administered 2020-02-05: 100 mL via INTRAVENOUS

## 2020-02-05 MED ORDER — THIAMINE HCL 100 MG/ML IJ SOLN
100.0000 mg | Freq: Every day | INTRAMUSCULAR | Status: DC
  Administered 2020-02-06: 100 mg via INTRAVENOUS
  Filled 2020-02-05: qty 2

## 2020-02-05 MED ORDER — LORAZEPAM 1 MG PO TABS: 1.0000 mg | ORAL_TABLET | ORAL | Status: AC | PRN

## 2020-02-05 MED ORDER — THIAMINE HCL 100 MG PO TABS
100.0000 mg | ORAL_TABLET | Freq: Every day | ORAL | Status: DC
  Administered 2020-02-07 – 2020-02-10 (×4): 100 mg via ORAL
  Filled 2020-02-05 (×5): qty 1

## 2020-02-05 MED ORDER — FOLIC ACID 1 MG PO TABS
1.0000 mg | ORAL_TABLET | Freq: Every day | ORAL | Status: DC
  Administered 2020-02-06 – 2020-02-10 (×5): 1 mg via ORAL
  Filled 2020-02-05 (×5): qty 1

## 2020-02-05 MED ORDER — LORAZEPAM 2 MG/ML IJ SOLN
1.0000 mg | INTRAMUSCULAR | Status: AC | PRN
  Administered 2020-02-06 – 2020-02-08 (×5): 2 mg via INTRAVENOUS
  Filled 2020-02-05 (×5): qty 1

## 2020-02-05 MED ORDER — KETAMINE HCL 50 MG/5ML IJ SOSY
0.3000 mg/kg | PREFILLED_SYRINGE | Freq: Once | INTRAMUSCULAR | Status: AC
  Administered 2020-02-05: 24 mg via INTRAVENOUS
  Filled 2020-02-05: qty 5
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
## 2020-02-05 NOTE — ED Notes (Signed)
1L NS infusion completed

## 2020-02-05 NOTE — ED Notes (Signed)
Pt ripped off ccollar and 02, pt refusing to keep these items on at this time
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
## 2020-02-05 NOTE — ED Notes (Signed)
Pt returned from CT with this RN, pt continues to be resistant to care measures, pulling off bp cuff. Splint in place to RLE
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
## 2020-02-06 ENCOUNTER — Observation Stay (HOSPITAL_COMMUNITY): Payer: No Typology Code available for payment source

## 2020-02-06 ENCOUNTER — Encounter (HOSPITAL_COMMUNITY)
Admission: EM | Discharge status: Against Medical Advice | Payer: Self-pay | Source: Home / Self Care | Attending: Orthopaedic Surgery

## 2020-02-06 ENCOUNTER — Other Ambulatory Visit: Payer: Self-pay

## 2020-02-06 ENCOUNTER — Observation Stay (HOSPITAL_COMMUNITY): Payer: No Typology Code available for payment source | Admitting: Anesthesiology

## 2020-02-06 ENCOUNTER — Encounter (HOSPITAL_COMMUNITY): Payer: Self-pay | Admitting: Orthopaedic Surgery

## 2020-02-06 HISTORY — PX: TIBIA IM NAIL INSERTION: SHX2516

## 2020-02-06 LAB — RAPID URINE DRUG SCREEN, HOSP PERFORMED
Amphetamines: NOT DETECTED
Barbiturates: NOT DETECTED
Benzodiazepines: NOT DETECTED
Cocaine: POSITIVE — AB
Opiates: POSITIVE — AB
Tetrahydrocannabinol: POSITIVE — AB

## 2020-02-06 LAB — SARS CORONAVIRUS 2 BY RT PCR (HOSPITAL ORDER, PERFORMED IN ~~LOC~~ HOSPITAL LAB): SARS Coronavirus 2: NEGATIVE

## 2020-02-06 LAB — URINALYSIS, ROUTINE W REFLEX MICROSCOPIC
Bacteria, UA: NONE SEEN
Bilirubin Urine: NEGATIVE
Glucose, UA: 50 mg/dL — AB
Ketones, ur: NEGATIVE mg/dL
Leukocytes,Ua: NEGATIVE
Nitrite: NEGATIVE
Protein, ur: NEGATIVE mg/dL
Specific Gravity, Urine: >1.046 — ABNORMAL HIGH (ref 1.005–1.030)
pH: 5 (ref 5.0–8.0)

## 2020-02-06 LAB — COMPREHENSIVE METABOLIC PANEL
ALT: 33 U/L (ref 0–44)
AST: 67 U/L — ABNORMAL HIGH (ref 15–41)
Albumin: 3.8 g/dL (ref 3.5–5.0)
Alkaline Phosphatase: 75 U/L (ref 38–126)
Anion gap: 16 — ABNORMAL HIGH (ref 5–15)
BUN: 12 mg/dL (ref 6–20)
CO2: 23 mmol/L (ref 22–32)
Calcium: 8.7 mg/dL — ABNORMAL LOW (ref 8.9–10.3)
Chloride: 106 mmol/L (ref 98–111)
Creatinine, Ser: 1.11 mg/dL (ref 0.61–1.24)
GFR calc Af Amer: >60 mL/min (ref >60)
GFR calc non Af Amer: >60 mL/min (ref >60)
Glucose, Bld: 128 mg/dL — ABNORMAL HIGH (ref 70–99)
Potassium: 3.6 mmol/L (ref 3.5–5.1)
Sodium: 145 mmol/L (ref 135–145)
Total Bilirubin: 0.3 mg/dL (ref 0.3–1.2)
Total Protein: 6.5 g/dL (ref 6.5–8.1)

## 2020-02-06 LAB — CREATININE, SERUM
Creatinine, Ser: 0.79 mg/dL (ref 0.61–1.24)
GFR calc Af Amer: >60 mL/min (ref >60)
GFR calc non Af Amer: >60 mL/min (ref >60)

## 2020-02-06 LAB — SURGICAL PCR SCREEN
MRSA, PCR: NEGATIVE
Staphylococcus aureus: NEGATIVE

## 2020-02-06 LAB — MAGNESIUM: Magnesium: 1.8 mg/dL (ref 1.7–2.4)

## 2020-02-06 LAB — PHOSPHORUS: Phosphorus: 3.5 mg/dL (ref 2.5–4.6)

## 2020-02-06 LAB — HIV ANTIBODY (ROUTINE TESTING W REFLEX): HIV Screen 4th Generation wRfx: NONREACTIVE

## 2020-02-06 LAB — CK: Total CK: 2139 U/L — ABNORMAL HIGH (ref 49–397)

## 2020-02-06 SURGERY — INSERTION, INTRAMEDULLARY ROD, TIBIA
Anesthesia: General | Site: Leg Lower | Laterality: Right

## 2020-02-06 MED ORDER — METHOCARBAMOL 1000 MG/10ML IJ SOLN: 500.0000 mg | Freq: Four times a day (QID) | INTRAVENOUS | Status: DC | PRN

## 2020-02-06 MED ORDER — ACETAMINOPHEN 500 MG PO TABS
1000.0000 mg | ORAL_TABLET | Freq: Three times a day (TID) | ORAL | 0 refills | Status: AC
Start: 2020-02-06 — End: 2020-02-20

## 2020-02-06 MED ORDER — LIDOCAINE 2% (20 MG/ML) 5 ML SYRINGE
INTRAMUSCULAR | Status: DC | PRN
  Administered 2020-02-06: 60 mg via INTRAVENOUS

## 2020-02-06 MED ORDER — LIDOCAINE 2% (20 MG/ML) 5 ML SYRINGE
INTRAMUSCULAR | Status: AC
  Filled 2020-02-06: qty 5

## 2020-02-06 MED ORDER — KETAMINE HCL 10 MG/ML IJ SOLN
INTRAMUSCULAR | Status: DC | PRN
  Administered 2020-02-06: 50 mg via INTRAVENOUS
  Administered 2020-02-06: 30 mg via INTRAVENOUS
  Administered 2020-02-06: 20 mg via INTRAVENOUS

## 2020-02-06 MED ORDER — VANCOMYCIN HCL 1000 MG IV SOLR
INTRAVENOUS | Status: AC
  Filled 2020-02-06: qty 1000

## 2020-02-06 MED ORDER — CHLORHEXIDINE GLUCONATE 0.12 % MT SOLN: 15.0000 mL | Freq: Once | OROMUCOSAL | Status: DC

## 2020-02-06 MED ORDER — TOBRAMYCIN SULFATE 1.2 G IJ SOLR
INTRAMUSCULAR | Status: AC
  Filled 2020-02-06: qty 1.2

## 2020-02-06 MED ORDER — FENTANYL CITRATE (PF) 100 MCG/2ML IJ SOLN
INTRAMUSCULAR | Status: DC | PRN
  Administered 2020-02-06: 250 ug via INTRAVENOUS
  Administered 2020-02-06 (×2): 50 ug via INTRAVENOUS
  Administered 2020-02-06: 100 ug via INTRAVENOUS
  Administered 2020-02-06: 50 ug via INTRAVENOUS

## 2020-02-06 MED ORDER — MAGNESIUM CITRATE PO SOLN: 1.0000 | Freq: Once | ORAL | Status: DC | PRN

## 2020-02-06 MED ORDER — HYDROMORPHONE HCL 1 MG/ML IJ SOLN
0.5000 mg | INTRAMUSCULAR | Status: DC | PRN
  Administered 2020-02-06 (×2): 1 mg via INTRAVENOUS
  Filled 2020-02-06 (×2): qty 1

## 2020-02-06 MED ORDER — ONDANSETRON HCL 4 MG/2ML IJ SOLN: 4.0000 mg | Freq: Once | INTRAMUSCULAR | Status: AC

## 2020-02-06 MED ORDER — OXYCODONE HCL 5 MG PO TABS
5.0000 mg | ORAL_TABLET | Freq: Once | ORAL | Status: DC | PRN
Start: 2020-02-06 — End: 2020-02-06

## 2020-02-06 MED ORDER — ACETAMINOPHEN 500 MG PO TABS
1000.0000 mg | ORAL_TABLET | Freq: Three times a day (TID) | ORAL | Status: AC
  Administered 2020-02-06 – 2020-02-09 (×11): 1000 mg via ORAL
  Filled 2020-02-06 (×11): qty 2

## 2020-02-06 MED ORDER — ONDANSETRON HCL 4 MG/2ML IJ SOLN
INTRAMUSCULAR | Status: AC
  Administered 2020-02-06: 4 mg via INTRAVENOUS
  Filled 2020-02-06: qty 2

## 2020-02-06 MED ORDER — BUPIVACAINE HCL (PF) 0.25 % IJ SOLN
INTRAMUSCULAR | Status: AC
  Filled 2020-02-06: qty 30

## 2020-02-06 MED ORDER — ONDANSETRON HCL 4 MG/2ML IJ SOLN
INTRAMUSCULAR | Status: DC | PRN
  Administered 2020-02-06: 4 mg via INTRAVENOUS

## 2020-02-06 MED ORDER — METOCLOPRAMIDE HCL 5 MG PO TABS: 5.0000 mg | ORAL_TABLET | Freq: Three times a day (TID) | ORAL | Status: DC | PRN

## 2020-02-06 MED ORDER — MIDAZOLAM HCL 2 MG/2ML IJ SOLN
INTRAMUSCULAR | Status: AC
  Filled 2020-02-06: qty 2

## 2020-02-06 MED ORDER — DEXAMETHASONE SODIUM PHOSPHATE 10 MG/ML IJ SOLN
INTRAMUSCULAR | Status: AC
  Filled 2020-02-06: qty 1

## 2020-02-06 MED ORDER — BISACODYL 5 MG PO TBEC
5.0000 mg | DELAYED_RELEASE_TABLET | Freq: Every day | ORAL | Status: DC | PRN
  Administered 2020-02-09: 5 mg via ORAL
  Filled 2020-02-06: qty 1

## 2020-02-06 MED ORDER — OXYCODONE HCL 5 MG PO TABS: ORAL_TABLET | ORAL | 0 refills | Status: DC

## 2020-02-06 MED ORDER — KETAMINE HCL 50 MG/5ML IJ SOSY
PREFILLED_SYRINGE | INTRAMUSCULAR | Status: AC
  Filled 2020-02-06: qty 5

## 2020-02-06 MED ORDER — ENOXAPARIN SODIUM 40 MG/0.4ML ~~LOC~~ SOLN
40.0000 mg | SUBCUTANEOUS | Status: DC
  Administered 2020-02-07 – 2020-02-10 (×4): 40 mg via SUBCUTANEOUS
  Filled 2020-02-06 (×4): qty 0.4

## 2020-02-06 MED ORDER — PROPOFOL 10 MG/ML IV BOLUS
INTRAVENOUS | Status: AC
  Filled 2020-02-06: qty 20

## 2020-02-06 MED ORDER — 0.9 % SODIUM CHLORIDE (POUR BTL) OPTIME
TOPICAL | Status: DC | PRN
  Administered 2020-02-06: 1000 mL

## 2020-02-06 MED ORDER — POVIDONE-IODINE 10 % EX SWAB: 2.0000 "application " | Freq: Once | CUTANEOUS | Status: DC

## 2020-02-06 MED ORDER — ASPIRIN 81 MG PO CHEW
81.0000 mg | CHEWABLE_TABLET | Freq: Two times a day (BID) | ORAL | 0 refills | Status: AC
Start: 2020-02-06 — End: 2020-03-19

## 2020-02-06 MED ORDER — SUCCINYLCHOLINE CHLORIDE 200 MG/10ML IV SOSY
PREFILLED_SYRINGE | INTRAVENOUS | Status: AC
  Filled 2020-02-06: qty 10

## 2020-02-06 MED ORDER — CELECOXIB 200 MG PO CAPS
200.0000 mg | ORAL_CAPSULE | Freq: Two times a day (BID) | ORAL | Status: DC
  Administered 2020-02-06 – 2020-02-10 (×10): 200 mg via ORAL
  Filled 2020-02-06 (×11): qty 1

## 2020-02-06 MED ORDER — DOCUSATE SODIUM 100 MG PO CAPS: 100.0000 mg | ORAL_CAPSULE | Freq: Two times a day (BID) | ORAL | Status: DC

## 2020-02-06 MED ORDER — LACTATED RINGERS IV SOLN: INTRAVENOUS | Status: DC

## 2020-02-06 MED ORDER — TOBRAMYCIN SULFATE 1.2 G IJ SOLR
INTRAMUSCULAR | Status: DC | PRN
  Administered 2020-02-06: 1.2 g via TOPICAL

## 2020-02-06 MED ORDER — DOCUSATE SODIUM 100 MG PO CAPS
100.0000 mg | ORAL_CAPSULE | Freq: Two times a day (BID) | ORAL | Status: DC
  Administered 2020-02-06 – 2020-02-10 (×9): 100 mg via ORAL
  Filled 2020-02-06 (×8): qty 1

## 2020-02-06 MED ORDER — PROPOFOL 10 MG/ML IV BOLUS
INTRAVENOUS | Status: DC | PRN
  Administered 2020-02-06: 200 mg via INTRAVENOUS

## 2020-02-06 MED ORDER — CHLORHEXIDINE GLUCONATE 0.12 % MT SOLN
OROMUCOSAL | Status: AC
  Filled 2020-02-06: qty 15

## 2020-02-06 MED ORDER — PROMETHAZINE HCL 25 MG/ML IJ SOLN
6.2500 mg | INTRAMUSCULAR | Status: DC | PRN
Start: 2020-02-06 — End: 2020-02-06

## 2020-02-06 MED ORDER — FENTANYL CITRATE (PF) 250 MCG/5ML IJ SOLN
INTRAMUSCULAR | Status: AC
  Filled 2020-02-06: qty 5

## 2020-02-06 MED ORDER — METHOCARBAMOL 500 MG PO TABS
500.0000 mg | ORAL_TABLET | Freq: Four times a day (QID) | ORAL | Status: DC | PRN
  Administered 2020-02-06 – 2020-02-10 (×14): 500 mg via ORAL
  Filled 2020-02-06 (×14): qty 1

## 2020-02-06 MED ORDER — ONDANSETRON HCL 4 MG/2ML IJ SOLN: 4.0000 mg | Freq: Four times a day (QID) | INTRAMUSCULAR | Status: DC | PRN

## 2020-02-06 MED ORDER — METOCLOPRAMIDE HCL 5 MG/ML IJ SOLN: 5.0000 mg | Freq: Three times a day (TID) | INTRAMUSCULAR | Status: DC | PRN

## 2020-02-06 MED ORDER — HYDROMORPHONE HCL 1 MG/ML IJ SOLN
INTRAMUSCULAR | Status: AC
  Filled 2020-02-06: qty 0.5

## 2020-02-06 MED ORDER — ONDANSETRON HCL 4 MG/2ML IJ SOLN
INTRAMUSCULAR | Status: AC
  Filled 2020-02-06: qty 2

## 2020-02-06 MED ORDER — OXYCODONE HCL 5 MG PO TABS
5.0000 mg | ORAL_TABLET | ORAL | Status: DC | PRN
  Administered 2020-02-06: 5 mg via ORAL
  Administered 2020-02-06 – 2020-02-07 (×3): 10 mg via ORAL
  Administered 2020-02-07: 5 mg via ORAL
  Administered 2020-02-07 – 2020-02-10 (×15): 10 mg via ORAL
  Filled 2020-02-06: qty 1
  Filled 2020-02-06 (×4): qty 2
  Filled 2020-02-06: qty 1
  Filled 2020-02-06 (×14): qty 2

## 2020-02-06 MED ORDER — ROCURONIUM BROMIDE 10 MG/ML (PF) SYRINGE
PREFILLED_SYRINGE | INTRAVENOUS | Status: AC
  Filled 2020-02-06: qty 10

## 2020-02-06 MED ORDER — HYDROMORPHONE HCL 1 MG/ML IJ SOLN
0.5000 mg | INTRAMUSCULAR | Status: DC | PRN
  Administered 2020-02-06 (×2): 1 mg via INTRAVENOUS
  Administered 2020-02-08: 0.5 mg via INTRAVENOUS
  Administered 2020-02-08: 1 mg via INTRAVENOUS
  Filled 2020-02-06 (×4): qty 1

## 2020-02-06 MED ORDER — SUCCINYLCHOLINE CHLORIDE 20 MG/ML IJ SOLN
INTRAMUSCULAR | Status: DC | PRN
  Administered 2020-02-06: 120 mg via INTRAVENOUS

## 2020-02-06 MED ORDER — SENNOSIDES-DOCUSATE SODIUM 8.6-50 MG PO TABS: 1.0000 | ORAL_TABLET | Freq: Every evening | ORAL | Status: DC | PRN

## 2020-02-06 MED ORDER — DEXAMETHASONE SODIUM PHOSPHATE 10 MG/ML IJ SOLN
INTRAMUSCULAR | Status: DC | PRN
  Administered 2020-02-06: 10 mg via INTRAVENOUS

## 2020-02-06 MED ORDER — ARTIFICIAL TEARS OPHTHALMIC OINT
TOPICAL_OINTMENT | OPHTHALMIC | Status: AC
  Filled 2020-02-06: qty 3.5

## 2020-02-06 MED ORDER — MIDAZOLAM HCL 5 MG/5ML IJ SOLN
INTRAMUSCULAR | Status: DC | PRN
  Administered 2020-02-06: 2 mg via INTRAVENOUS

## 2020-02-06 MED ORDER — CHLORHEXIDINE GLUCONATE 4 % EX LIQD
60.0000 mL | Freq: Once | CUTANEOUS | Status: DC
Start: 2020-02-06 — End: 2020-02-06

## 2020-02-06 MED ORDER — DIPHENHYDRAMINE HCL 12.5 MG/5ML PO ELIX
12.5000 mg | ORAL_SOLUTION | ORAL | Status: DC | PRN
  Administered 2020-02-06 – 2020-02-09 (×3): 25 mg via ORAL
  Filled 2020-02-06 (×3): qty 10

## 2020-02-06 MED ORDER — ONDANSETRON HCL 4 MG PO TABS: 4.0000 mg | ORAL_TABLET | Freq: Three times a day (TID) | ORAL | 1 refills | Status: AC | PRN

## 2020-02-06 MED ORDER — OXYCODONE HCL 5 MG PO TABS
5.0000 mg | ORAL_TABLET | ORAL | Status: DC | PRN
  Administered 2020-02-06: 10 mg via ORAL
  Filled 2020-02-06: qty 2

## 2020-02-06 MED ORDER — SUGAMMADEX SODIUM 200 MG/2ML IV SOLN
INTRAVENOUS | Status: DC | PRN
Start: 2020-02-06 — End: 2020-02-06
  Administered 2020-02-06: 200 mg via INTRAVENOUS

## 2020-02-06 MED ORDER — BUPIVACAINE HCL (PF) 0.25 % IJ SOLN
INTRAMUSCULAR | Status: DC | PRN
  Administered 2020-02-06: 20 mL

## 2020-02-06 MED ORDER — CLINDAMYCIN PHOSPHATE 900 MG/50ML IV SOLN
900.0000 mg | INTRAVENOUS | Status: AC
  Administered 2020-02-06: 900 mg via INTRAVENOUS

## 2020-02-06 MED ORDER — ONDANSETRON HCL 4 MG PO TABS: 4.0000 mg | ORAL_TABLET | Freq: Four times a day (QID) | ORAL | Status: DC | PRN

## 2020-02-06 MED ORDER — OXYCODONE HCL 5 MG/5ML PO SOLN
5.0000 mg | Freq: Once | ORAL | Status: DC | PRN
Start: 2020-02-06 — End: 2020-02-06

## 2020-02-06 MED ORDER — FENTANYL CITRATE (PF) 100 MCG/2ML IJ SOLN: 25.0000 ug | INTRAMUSCULAR | Status: DC | PRN

## 2020-02-06 MED ORDER — ONDANSETRON HCL 4 MG/2ML IJ SOLN
4.0000 mg | Freq: Four times a day (QID) | INTRAMUSCULAR | Status: DC | PRN
  Administered 2020-02-06: 4 mg via INTRAVENOUS
  Filled 2020-02-06: qty 2

## 2020-02-06 MED ORDER — NICOTINE 21 MG/24HR TD PT24
21.0000 mg | MEDICATED_PATCH | Freq: Every day | TRANSDERMAL | Status: DC
  Administered 2020-02-06 – 2020-02-10 (×5): 21 mg via TRANSDERMAL
  Filled 2020-02-06 (×5): qty 1

## 2020-02-06 MED ORDER — VANCOMYCIN HCL 1000 MG IV SOLR
INTRAVENOUS | Status: DC | PRN
  Administered 2020-02-06: 1000 mg via TOPICAL

## 2020-02-06 MED ORDER — CLINDAMYCIN PHOSPHATE 900 MG/50ML IV SOLN
INTRAVENOUS | Status: AC
  Filled 2020-02-06: qty 50

## 2020-02-06 MED ORDER — POLYETHYLENE GLYCOL 3350 17 G PO PACK
17.0000 g | PACK | Freq: Every day | ORAL | Status: DC | PRN
  Administered 2020-02-09: 17 g via ORAL
  Filled 2020-02-06: qty 1

## 2020-02-06 MED ORDER — CLINDAMYCIN PHOSPHATE 600 MG/50ML IV SOLN
600.0000 mg | Freq: Four times a day (QID) | INTRAVENOUS | Status: AC
  Administered 2020-02-06 – 2020-02-07 (×3): 600 mg via INTRAVENOUS
  Filled 2020-02-06 (×3): qty 50

## 2020-02-06 MED ORDER — ROCURONIUM BROMIDE 10 MG/ML (PF) SYRINGE
PREFILLED_SYRINGE | INTRAVENOUS | Status: DC | PRN
  Administered 2020-02-06: 60 mg via INTRAVENOUS

## 2020-02-06 MED ORDER — CELECOXIB 100 MG PO CAPS
100.0000 mg | ORAL_CAPSULE | Freq: Two times a day (BID) | ORAL | 0 refills | Status: AC
Start: 2020-02-06 — End: 2020-03-07

## 2020-02-06 SURGICAL SUPPLY — 71 items
BIT DRILL 3.8X6 NS (BIT) ×6 IMPLANT
BIT DRILL 4.4 NS (BIT) ×3 IMPLANT
BNDG COHESIVE 4X5 TAN STRL (GAUZE/BANDAGES/DRESSINGS) ×3 IMPLANT
BNDG ELASTIC 4X5.8 VLCR STR LF (GAUZE/BANDAGES/DRESSINGS) ×3 IMPLANT
BNDG ELASTIC 6X5.8 VLCR STR LF (GAUZE/BANDAGES/DRESSINGS) ×6 IMPLANT
BNDG GAUZE ELAST 4 BULKY (GAUZE/BANDAGES/DRESSINGS) ×3 IMPLANT
CANISTER SUCT 3000ML PPV (MISCELLANEOUS) ×3 IMPLANT
CHLORAPREP W/TINT 26 (MISCELLANEOUS) ×6 IMPLANT
CLOSURE WOUND 1/2 X4 (GAUZE/BANDAGES/DRESSINGS) ×1
COVER MAYO STAND STRL (DRAPES) ×3 IMPLANT
COVER SURGICAL LIGHT HANDLE (MISCELLANEOUS) ×3 IMPLANT
COVER WAND RF STERILE (DRAPES) IMPLANT
DERMABOND ADVANCED (GAUZE/BANDAGES/DRESSINGS) ×2
DERMABOND ADVANCED .7 DNX12 (GAUZE/BANDAGES/DRESSINGS) ×1 IMPLANT
DRAPE C-ARM 42X72 X-RAY (DRAPES) ×3 IMPLANT
DRAPE C-ARMOR (DRAPES) ×3 IMPLANT
DRAPE IMP U-DRAPE 54X76 (DRAPES) ×3 IMPLANT
DRAPE U-SHAPE 47X51 STRL (DRAPES) ×3 IMPLANT
DRSG AQUACEL AG ADV 3.5X 6 (GAUZE/BANDAGES/DRESSINGS) IMPLANT
DRSG XEROFORM 1X8 (GAUZE/BANDAGES/DRESSINGS) ×3 IMPLANT
ELECT CAUTERY BLADE 6.4 (BLADE) ×3 IMPLANT
ELECT REM PT RETURN 9FT ADLT (ELECTROSURGICAL) ×3
ELECTRODE REM PT RTRN 9FT ADLT (ELECTROSURGICAL) ×1 IMPLANT
GAUZE SPONGE 4X4 12PLY STRL (GAUZE/BANDAGES/DRESSINGS) IMPLANT
GAUZE SPONGE 4X4 12PLY STRL LF (GAUZE/BANDAGES/DRESSINGS) ×3 IMPLANT
GAUZE XEROFORM 1X8 LF (GAUZE/BANDAGES/DRESSINGS) IMPLANT
GAUZE XEROFORM 5X9 LF (GAUZE/BANDAGES/DRESSINGS) ×3 IMPLANT
GLOVE BIO SURGEON STRL SZ 6.5 (GLOVE) ×4 IMPLANT
GLOVE BIO SURGEONS STRL SZ 6.5 (GLOVE) ×2
GLOVE BIOGEL PI IND STRL 8 (GLOVE) ×1 IMPLANT
GLOVE BIOGEL PI INDICATOR 8 (GLOVE) ×2
GLOVE ECLIPSE 8.0 STRL XLNG CF (GLOVE) ×3 IMPLANT
GLOVE INDICATOR 6.5 STRL GRN (GLOVE) ×3 IMPLANT
GOWN STRL REUS W/ TWL LRG LVL3 (GOWN DISPOSABLE) ×2 IMPLANT
GOWN STRL REUS W/ TWL XL LVL3 (GOWN DISPOSABLE) ×1 IMPLANT
GOWN STRL REUS W/TWL LRG LVL3 (GOWN DISPOSABLE) ×4
GOWN STRL REUS W/TWL XL LVL3 (GOWN DISPOSABLE) ×2
GUIDEPIN 3.2X17.5 THRD DISP (PIN) ×3 IMPLANT
GUIDEWIRE BALL NOSE 80CM (WIRE) ×3 IMPLANT
KIT BASIN OR (CUSTOM PROCEDURE TRAY) ×3 IMPLANT
KIT TURNOVER KIT B (KITS) ×3 IMPLANT
NAIL TIBIAL 9MMX36CM (Nail) ×3 IMPLANT
NS IRRIG 1000ML POUR BTL (IV SOLUTION) ×3 IMPLANT
PACK ORTHO EXTREMITY (CUSTOM PROCEDURE TRAY) ×3 IMPLANT
PAD ABD 8X10 STRL (GAUZE/BANDAGES/DRESSINGS) ×3 IMPLANT
PAD ARMBOARD 7.5X6 YLW CONV (MISCELLANEOUS) ×6 IMPLANT
PAD CAST 4YDX4 CTTN HI CHSV (CAST SUPPLIES) ×1 IMPLANT
PADDING CAST COTTON 4X4 STRL (CAST SUPPLIES) ×2
PADDING CAST COTTON 6X4 STRL (CAST SUPPLIES) ×3 IMPLANT
SCREW ACECAP 38MM (Screw) ×3 IMPLANT
SCREW ACECAP 44MM (Screw) ×3 IMPLANT
SCREW PROXIMAL DEPUY (Screw) ×2 IMPLANT
SCREW PROXIMAL75MMLX5.5MM (Screw) ×3 IMPLANT
SCREW PRXML FT 60X5.5XNS LF (Screw) ×1 IMPLANT
SPLINT PLASTER CAST XFAST 5X30 (CAST SUPPLIES) ×1 IMPLANT
SPLINT PLASTER XFAST SET 5X30 (CAST SUPPLIES) ×2
SPONGE LAP 18X18 RF (DISPOSABLE) IMPLANT
STAPLER VISISTAT 35W (STAPLE) IMPLANT
STRIP CLOSURE SKIN 1/2X4 (GAUZE/BANDAGES/DRESSINGS) ×2 IMPLANT
SUT ETHILON 3 0 PS 1 (SUTURE) IMPLANT
SUT MNCRL AB 4-0 PS2 18 (SUTURE) ×6 IMPLANT
SUT VIC AB 1 CT1 27 (SUTURE) ×2
SUT VIC AB 1 CT1 27XBRD ANBCTR (SUTURE) ×1 IMPLANT
SUT VIC AB 2-0 CT1 27 (SUTURE) ×2
SUT VIC AB 2-0 CT1 TAPERPNT 27 (SUTURE) ×1 IMPLANT
SUT VIC AB 3-0 CT1 27 (SUTURE) ×3
SUT VIC AB 3-0 CT1 TAPERPNT 27 (SUTURE) ×1 IMPLANT
TUBE CONNECTING 12'X1/4 (SUCTIONS) ×1
TUBE CONNECTING 12X1/4 (SUCTIONS) ×2 IMPLANT
WATER STERILE IRR 1000ML POUR (IV SOLUTION) ×3 IMPLANT
YANKAUER SUCT BULB TIP NO VENT (SUCTIONS) ×3 IMPLANT

## 2020-02-06 NOTE — Anesthesia Preprocedure Evaluation (Addendum)
Anesthesia Evaluation  Patient identified by MRN, date of birth, ID band Patient awake    Reviewed: Allergy & Precautions, NPO status , Patient's Chart, lab work & pertinent test results  History of Anesthesia Complications Negative for: history of anesthetic complications  Airway Mallampati: III  TM Distance: >3 FB Neck ROM: Full    Dental  (+) Dental Advisory Given, Poor Dentition   Pulmonary asthma (does not have inhaler) , Current Smoker and Patient abstained from smoking.,    Pulmonary exam normal        Cardiovascular negative cardio ROS Normal cardiovascular exam     Neuro/Psych negative neurological ROS  negative psych ROS   GI/Hepatic negative GI ROS, (+)     substance abuse  alcohol use, cocaine use, marijuana use and methamphetamine use,   Endo/Other  negative endocrine ROS  Renal/GU negative Renal ROS     Musculoskeletal negative musculoskeletal ROS (+)   Abdominal   Peds  Hematology negative hematology ROS (+)   Anesthesia Other Findings Covid neg 5/28  Reproductive/Obstetrics                            Anesthesia Physical Anesthesia Plan  ASA: III  Anesthesia Plan: General   Post-op Pain Management:    Induction: Intravenous  PONV Risk Score and Plan: 2 and Treatment may vary due to age or medical condition, Ondansetron, Dexamethasone and Midazolam  Airway Management Planned: Oral ETT  Additional Equipment: None  Intra-op Plan:   Post-operative Plan: Extubation in OR  Informed Consent: I have reviewed the patients History and Physical, chart, labs and discussed the procedure including the risks, benefits and alternatives for the proposed anesthesia with the patient or authorized representative who has indicated his/her understanding and acceptance.     Dental advisory given  Plan Discussed with: CRNA and Anesthesiologist  Anesthesia Plan Comments:         Anesthesia Quick Evaluation

## 2020-02-06 NOTE — Discharge Instructions (Signed)
Ramond Marrow MD, MPH [facility] [address], [address] [phone] (tel)   [phone] (fax)   POST-OPERATIVE INSTRUCTIONS - LOWER EXTREMITY   WOUND CARE ? Please keep splint clean dry and intact until followup.  ? You may shower on Post-Op Day #2.  ? You must keep splint dry during this process and may find that a plastic bag taped around the leg or alternatively a towel based bath may be a better option.   ? If you get your splint wet or if it is damaged please contact our clinic.  EXERCISES ? Due to your splint being in place you will not be able to bear weight through your extremity.   ? DO NOT PUT ANY WEIGHT ON YOUR OPERATIVE LEG ? Please use crutches or a walker to avoid weight bearing.   REGIONAL ANESTHESIA (NERVE BLOCKS) . The anesthesia team may have performed a nerve block for you if safe in the setting of your care.  This is a great tool used to minimize pain.  Typically the block may start wearing off overnight but the long acting medicine may last for 3-4 days.  The nerve block wearing off can be a challenging period but please utilize your as needed pain medications to try and manage this period.    POST-OP MEDICATIONS- Multimodal approach to pain control . In general your pain will be controlled with a combination of substances.  Prescriptions unless otherwise discussed are electronically sent to your pharmacy.  This is a carefully made plan we use to minimize narcotic use.     - Meloxicam OR Celebrex - Anti-inflammatory medication taken on a scheduled basis  - Acetaminophen - Non-narcotic pain medicine taken on a scheduled basis   - Oxycodone - This is a strong narcotic, to be used only on an "as needed" basis for pain.  -  Aspirin 81mg  - This medicine is used to minimize the risk of blood clots after surgery.             -          Zofran - take as needed for nausea   FOLLOW-UP ? If you develop a Fever (>101.5), Redness or Drainage from the  surgical incision site, please call our office to arrange for an evaluation. ? Please call the office to schedule a follow-up appointment for your incision check if you do not already have one, 7-10 days post-operatively.  IF YOU HAVE ANY QUESTIONS, PLEASE FEEL FREE TO CALL OUR OFFICE.  HELPFUL INFORMATION  ? If you had a block, it will wear off between 8-24 hrs postop typically.  This is period when your pain may go from nearly zero to the pain you would have had postop without the block.  This is an abrupt transition but nothing dangerous is happening.  You may take an extra dose of narcotic when this happens.  ? You should wean off your narcotic medicines as soon as you are able.  Most patients will be off or using minimal narcotics before their first postop appointment.   ? We suggest you use the pain medication the first night prior to going to bed, in order to ease any pain when the anesthesia wears off. You should avoid taking pain medications on an empty stomach as it will make you nauseous.  ? Do not drink alcoholic beverages or take illicit drugs when taking pain medications.  ? In most states it is against the law to drive while  you are in a splint or sling.  And certainly against the law to drive while taking narcotics.  ? You may return to work/school in the next couple of days when you feel up to it.   ? Pain medication may make you constipated.  Below are a few solutions to try in this order: - Decrease the amount of pain medication if you aren't having pain. - Drink lots of decaffeinated fluids. - Drink prune juice and/or each dried prunes  o If the first 3 don't work start with additional solutions - Take Colace - an over-the-counter stool softener - Take Senokot - an over-the-counter laxative - Take Miralax - a stronger over-the-counter laxative

## 2020-02-06 NOTE — Anesthesia Procedure Notes (Signed)
Procedure Name: Intubation Date/Time: 02/06/2020 9:45 AM Performed by: Kyung Rudd, CRNA Pre-anesthesia Checklist: Patient identified, Emergency Drugs available, Suction available, Patient being monitored and Timeout performed Patient Re-evaluated:Patient Re-evaluated prior to induction Oxygen Delivery Method: Circle system utilized Preoxygenation: Pre-oxygenation with 100% oxygen Induction Type: IV induction, Rapid sequence and Cricoid Pressure applied Laryngoscope Size: Mac and 3 Grade View: Grade I Tube type: Oral Tube size: 7.5 mm Number of attempts: 1 Airway Equipment and Method: Stylet Placement Confirmation: ETT inserted through vocal cords under direct vision,  positive ETCO2 and breath sounds checked- equal and bilateral Secured at: 21 cm Tube secured with: Tape Dental Injury: Teeth and Oropharynx as per pre-operative assessment

## 2020-02-06 NOTE — Interval H&P Note (Signed)
History and Physical Interval Note:  02/06/2020 7:25 AM  Michael Cordova  has presented today for surgery, with the diagnosis of RIGHT TIBIA FRACTURE.  The various methods of treatment have been discussed with the patient and family. After consideration of risks, benefits and other options for treatment, the patient has consented to  Procedure(s): INTRAMEDULLARY (IM) NAIL TIBIAL (Right) as a surgical intervention.  The patient's history has been reviewed, patient examined, no change in status, stable for surgery.  I have reviewed the patient's chart and labs.  Questions were answered to the patient's satisfaction.     Bjorn Pippin
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
## 2020-02-06 NOTE — H&P (Signed)
See consult note from same day.

## 2020-02-06 NOTE — Plan of Care (Signed)
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
## 2020-02-06 NOTE — Plan of Care (Signed)

## 2020-02-06 NOTE — Transfer of Care (Signed)
Immediate Anesthesia Transfer of Care Note  Patient: Michael Cordova  Procedure(s) Performed: INTRAMEDULLARY (IM) NAIL TIBIAL (Right Leg Lower)  Patient Location: PACU  Anesthesia Type:General  Level of Consciousness: awake, alert  and oriented  Airway & Oxygen Therapy: Patient Spontanous Breathing  Post-op Assessment: Report given to RN and Post -op Vital signs reviewed and stable  Post vital signs: Reviewed and stable  Last Vitals:  Vitals Value Taken Time  BP    Temp    Pulse    Resp    SpO2      Last Pain:  Vitals:   02/06/20 1140  TempSrc:   PainSc: (P) 4       Patients Stated Pain Goal: (P) 1 (02/06/20 1140)  Complications: No apparent anesthesia complications
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
## 2020-02-06 NOTE — Progress Notes (Signed)
Report called to OR. Check list complete. Consents signed and in chart.

## 2020-02-06 NOTE — Anesthesia Postprocedure Evaluation (Signed)
Anesthesia Post Note  Patient: Michael Cordova  Procedure(s) Performed: INTRAMEDULLARY (IM) NAIL TIBIAL (Right Leg Lower)     Patient location during evaluation: PACU Anesthesia Type: General Level of consciousness: awake and alert Pain management: pain level controlled Vital Signs Assessment: post-procedure vital signs reviewed and stable Respiratory status: spontaneous breathing, nonlabored ventilation and respiratory function stable Cardiovascular status: blood pressure returned to baseline and stable Postop Assessment: no apparent nausea or vomiting Anesthetic complications: no    Last Vitals:  Vitals:   02/06/20 1155 02/06/20 1212  BP: 133/83 (!) 136/96  Pulse: 83 80  Resp: 12 16  Temp: 36.7 C 36.7 C  SpO2: 99% 92%    Last Pain:  Vitals:   02/06/20 1212  TempSrc: Oral  PainSc:                  Beryle Lathe

## 2020-02-07 ENCOUNTER — Encounter (HOSPITAL_COMMUNITY): Payer: Self-pay | Admitting: Orthopaedic Surgery

## 2020-02-07 DIAGNOSIS — Z590 Homelessness: Secondary | ICD-10-CM | POA: Diagnosis not present

## 2020-02-07 DIAGNOSIS — Y908 Blood alcohol level of 240 mg/100 ml or more: Secondary | ICD-10-CM | POA: Diagnosis present

## 2020-02-07 DIAGNOSIS — F1290 Cannabis use, unspecified, uncomplicated: Secondary | ICD-10-CM | POA: Diagnosis present

## 2020-02-07 DIAGNOSIS — F17200 Nicotine dependence, unspecified, uncomplicated: Secondary | ICD-10-CM | POA: Diagnosis present

## 2020-02-07 DIAGNOSIS — S82491A Other fracture of shaft of right fibula, initial encounter for closed fracture: Secondary | ICD-10-CM | POA: Diagnosis present

## 2020-02-07 DIAGNOSIS — Y92410 Unspecified street and highway as the place of occurrence of the external cause: Secondary | ICD-10-CM | POA: Diagnosis not present

## 2020-02-07 DIAGNOSIS — Z5329 Procedure and treatment not carried out because of patient's decision for other reasons: Secondary | ICD-10-CM | POA: Diagnosis not present

## 2020-02-07 DIAGNOSIS — Z20822 Contact with and (suspected) exposure to covid-19: Secondary | ICD-10-CM | POA: Diagnosis present

## 2020-02-07 DIAGNOSIS — S82261A Displaced segmental fracture of shaft of right tibia, initial encounter for closed fracture: Secondary | ICD-10-CM | POA: Diagnosis present

## 2020-02-07 DIAGNOSIS — M549 Dorsalgia, unspecified: Secondary | ICD-10-CM | POA: Diagnosis present

## 2020-02-07 DIAGNOSIS — F10220 Alcohol dependence with intoxication, uncomplicated: Secondary | ICD-10-CM | POA: Diagnosis present

## 2020-02-07 LAB — BASIC METABOLIC PANEL
Anion gap: 11 (ref 5–15)
BUN: 9 mg/dL (ref 6–20)
CO2: 28 mmol/L (ref 22–32)
Calcium: 9.1 mg/dL (ref 8.9–10.3)
Chloride: 99 mmol/L (ref 98–111)
Creatinine, Ser: 0.92 mg/dL (ref 0.61–1.24)
GFR calc Af Amer: >60 mL/min (ref >60)
GFR calc non Af Amer: >60 mL/min (ref >60)
Glucose, Bld: 102 mg/dL — ABNORMAL HIGH (ref 70–99)
Potassium: 4.2 mmol/L (ref 3.5–5.1)
Sodium: 138 mmol/L (ref 135–145)

## 2020-02-07 LAB — CBC
HCT: 37.9 % — ABNORMAL LOW (ref 39.0–52.0)
Hemoglobin: 12.6 g/dL — ABNORMAL LOW (ref 13.0–17.0)
MCH: 32.6 pg (ref 26.0–34.0)
MCHC: 33.2 g/dL (ref 30.0–36.0)
MCV: 98.2 fL (ref 80.0–100.0)
Platelets: 199 10*3/uL (ref 150–400)
RBC: 3.86 MIL/uL — ABNORMAL LOW (ref 4.22–5.81)
RDW: 12.7 % (ref 11.5–15.5)
WBC: 13 10*3/uL — ABNORMAL HIGH (ref 4.0–10.5)
nRBC: 0 % (ref 0.0–0.2)

## 2020-02-07 NOTE — Progress Notes (Signed)
    Subjective: Patient reports pain as moderate.  Tolerating diet.  Urinating.  Up to chair with physical therapy.  Patient is homeless and has no living family or place to stay.  [place].  He is an  Alcoholic.    Objective:   VITALS:   Vitals:   02/06/20 1615 02/06/20 1937 02/07/20 0330 02/07/20 0900  BP: 134/85 123/84 120/87 (!) 117/92  Pulse: 82 62 66 72  Resp: 15 12 13    Temp: 98 F (36.7 C) 98.6 F (37 C) 98.6 F (37 C) 98.7 F (37.1 C)  TempSrc: Oral Oral Oral Oral  SpO2: 95% 99% 99% 98%  Weight:      Height:       CBC Latest Ref Rng & Units 02/05/2020 02/05/2020  WBC 4.0 - 10.5 K/uL 11.7(H) -  Hemoglobin 13.0 - 17.0 g/dL 02/07/2020 83.4  Hematocrit 19.6 - 52.0 % 40.6 42.0  Platelets 150 - 400 K/uL 264 -   BMP Latest Ref Rng & Units 02/06/2020 02/05/2020 02/05/2020  Glucose 70 - 99 mg/dL - 02/07/2020) 979(G)  BUN 6 - 20 mg/dL - 12 12  Creatinine 921(J - 1.24 mg/dL 9.41 7.40 8.14)  Sodium 135 - 145 mmol/L - 145 145  Potassium 3.5 - 5.1 mmol/L - 3.6 3.6  Chloride 98 - 111 mmol/L - 106 105  CO2 22 - 32 mmol/L - 23 -  Calcium 8.9 - 10.3 mg/dL - 8.7(L) -   Intake/Output      05/29 0701 - 05/30 0700 05/30 0701 - 05/31 0700   I.V. (mL/kg) 1873.1 (23.7)    IV Piggyback 200    Total Intake(mL/kg) 2073.1 (26.2)    Urine (mL/kg/hr) 400 (0.2) 300 (1.1)   Blood 50    Total Output 450 300   Net +1623.1 -300           Physical Exam: General: NAD.  Upright in chair.  Calm, conversant. Resp: No increased wob Cardio: regular rate and rhythm ABD soft Neurologically intact MSK RLE: Splint and compressive dressings intact and in good condition. Toes warm.  EHL, FHL intact.  Sensation intact distally.  No pain with passive range of motion of toes. Calf compressible around splint.  Assessment: 1 Day Post-Op  S/P Procedure(s) (LRB): INTRAMEDULLARY (IM) NAIL TIBIAL (Right) by Dr. 6/31 on 02/06/2020  Active Problems:   Displaced comminuted fracture of  shaft of right tibia, initial encounter for closed fracture   Right segmental tibia fracture Stable status post IM nail Early mobilization  Alcoholism, substance abuse Continue CIWA.  Tobacco abuse Cessation discussed including risks of same perioperatively   Plan: Up with therapy Incentive Spirometry Elevate  Social issues: Homeless without social support. TOC team consulted. SNF recommended by PT  Weightbearing: NWB RLE Insicional and dressing care: Dressings left intact until follow-up Orthopedic device(s): Splint Showering: Keep dressing dry VTE prophylaxis: Lovenox while inpatient.  81 mg aspirin twice daily for 6 weeks after discharge, ambulation Pain control: Elevate.  Wean narcotics Follow - up plan: 1 week in the office for incisional check and x-ray.  Dispo: TBD.  Patient homeless without social support - May need SNF.  TOC team consulted.     02/08/2020 III, PA-C 02/07/2020, 10:36 AM
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
## 2020-02-08 LAB — BASIC METABOLIC PANEL
Anion gap: 8 (ref 5–15)
BUN: 7 mg/dL (ref 6–20)
CO2: 26 mmol/L (ref 22–32)
Calcium: 8.9 mg/dL (ref 8.9–10.3)
Chloride: 102 mmol/L (ref 98–111)
Creatinine, Ser: 0.87 mg/dL (ref 0.61–1.24)
GFR calc Af Amer: >60 mL/min (ref >60)
GFR calc non Af Amer: >60 mL/min (ref >60)
Glucose, Bld: 100 mg/dL — ABNORMAL HIGH (ref 70–99)
Potassium: 3.9 mmol/L (ref 3.5–5.1)
Sodium: 136 mmol/L (ref 135–145)

## 2020-02-08 LAB — CBC
HCT: 33.8 % — ABNORMAL LOW (ref 39.0–52.0)
Hemoglobin: 11.5 g/dL — ABNORMAL LOW (ref 13.0–17.0)
MCH: 33.4 pg (ref 26.0–34.0)
MCHC: 34 g/dL (ref 30.0–36.0)
MCV: 98.3 fL (ref 80.0–100.0)
Platelets: 173 10*3/uL (ref 150–400)
RBC: 3.44 MIL/uL — ABNORMAL LOW (ref 4.22–5.81)
RDW: 12.6 % (ref 11.5–15.5)
WBC: 10.5 10*3/uL (ref 4.0–10.5)
nRBC: 0 % (ref 0.0–0.2)

## 2020-02-08 MED ORDER — GABAPENTIN 300 MG PO CAPS
300.0000 mg | ORAL_CAPSULE | Freq: Three times a day (TID) | ORAL | Status: DC
  Administered 2020-02-08 – 2020-02-10 (×7): 300 mg via ORAL
  Filled 2020-02-08 (×7): qty 1

## 2020-02-08 NOTE — Plan of Care (Signed)
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
## 2020-02-08 NOTE — Progress Notes (Signed)
    Subjective: Patient reports pain as worse today.  Asking for pain medicine.  Tolerating diet.  Urinating.  +Flatus.  No BM.  Up to bathroom with walker independently.  Patient is homeless and has no living family or place to stay.  He stays in a [place] in the woods.  He is an  Alcoholic.  TOC team Consult pending.  Objective:   VITALS:   Vitals:   02/07/20 0900 02/07/20 1435 02/07/20 2054 02/08/20 0438  BP: (!) 117/92 120/88 118/85 115/83  Pulse: 72 76 80 67  Resp:  16 13 12   Temp: 98.7 F (37.1 C) 98.8 F (37.1 C) 98.6 F (37 C) 97.9 F (36.6 C)  TempSrc: Oral Oral Oral Oral  SpO2: 98% 98% 97% 97%  Weight:      Height:       CBC Latest Ref Rng & Units 02/08/2020 02/07/2020 02/05/2020  WBC 4.0 - 10.5 K/uL 10.5 13.0(H) 11.7(H)  Hemoglobin 13.0 - 17.0 g/dL 11.5(L) 12.6(L) 14.1  Hematocrit 39.0 - 52.0 % 33.8(L) 37.9(L) 40.6  Platelets 150 - 400 K/uL 173 199 264   BMP Latest Ref Rng & Units 02/08/2020 02/07/2020 02/06/2020  Glucose 70 - 99 mg/dL 02/08/2020) 672(C) -  BUN 6 - 20 mg/dL 7 9 -  Creatinine 947(S - 1.24 mg/dL 9.62 8.36 6.29  Sodium 135 - 145 mmol/L 136 138 -  Potassium 3.5 - 5.1 mmol/L 3.9 4.2 -  Chloride 98 - 111 mmol/L 102 99 -  CO2 22 - 32 mmol/L 26 28 -  Calcium 8.9 - 10.3 mg/dL 8.9 9.1 -   Intake/Output      05/30 0701 - 05/31 0700 05/31 0701 - 06/01 0700   I.V. (mL/kg)     IV Piggyback     Total Intake(mL/kg)     Urine (mL/kg/hr) 800 (0.4)    Blood     Total Output 800    Net -800            Physical Exam: General: NAD.  Upright in bed.  Calm, conversant. Resp: No increased wob Cardio: regular rate and rhythm ABD soft Neurologically intact MSK RLE: Splint and compressive dressings intact and in good condition. Toes warm.  EHL, FHL intact.  Sensation intact distally.  No pain with passive range of motion of toes. Calf compressible around splint.  Assessment: 2 Days Post-Op  S/P Procedure(s) (LRB): INTRAMEDULLARY (IM) NAIL TIBIAL (Right) by Dr.  08/01 on 02/06/2020  Active Problems:   Displaced comminuted fracture of shaft of right tibia, initial encounter for closed fracture   Right segmental tibia fracture Stable status post IM nail Early mobilization  Alcoholism, substance abuse Continue CIWA.  Tobacco abuse Cessation discussed including risks of same perioperatively  Homelessness TOC team consult pending   Plan: Up with therapy Incentive Spirometry Elevate  Social issues: Homeless without social support. TOC team consult pending. SNF recommended by PT  Weightbearing: NWB RLE Insicional and dressing care: Dressings left intact until follow-up Orthopedic device(s): Splint Showering: Keep dressing dry VTE prophylaxis: Lovenox while inpatient.  81 mg aspirin twice daily for 6 weeks after discharge, ambulation Pain control: Elevate.  Tylenol, Celebrex.  Wean narcotics - IV for breakthrough only.  Will add gabapentin. Follow - up plan: 1 week in the office for incisional check and x-ray.  Dispo: Still TBD.   TOC team consulted.  Patient homeless without social support - May need SNF.    02/08/2020 Martensen III, PA-C 02/08/2020, 9:05 AM

## 2020-02-09 MED ORDER — ACETAMINOPHEN 500 MG PO TABS
1000.0000 mg | ORAL_TABLET | Freq: Three times a day (TID) | ORAL | Status: DC
  Administered 2020-02-09 – 2020-02-10 (×3): 1000 mg via ORAL
  Filled 2020-02-09 (×3): qty 2

## 2020-02-09 MED ORDER — LORAZEPAM 1 MG PO TABS
0.0000 mg | ORAL_TABLET | Freq: Four times a day (QID) | ORAL | Status: DC
  Administered 2020-02-09: 2 mg via ORAL
  Filled 2020-02-09 (×2): qty 2

## 2020-02-09 MED ORDER — HYDROMORPHONE HCL 1 MG/ML IJ SOLN
1.0000 mg | Freq: Once | INTRAMUSCULAR | Status: AC
  Administered 2020-02-09: 1 mg via INTRAVENOUS
  Filled 2020-02-09: qty 1

## 2020-02-09 MED ORDER — LORAZEPAM 1 MG PO TABS: 1.0000 mg | ORAL_TABLET | ORAL | Status: DC | PRN

## 2020-02-09 MED ORDER — LORAZEPAM 1 MG PO TABS: 0.0000 mg | ORAL_TABLET | Freq: Two times a day (BID) | ORAL | Status: DC

## 2020-02-09 MED ORDER — LORAZEPAM 2 MG/ML IJ SOLN
1.0000 mg | INTRAMUSCULAR | Status: DC | PRN
  Administered 2020-02-09 – 2020-02-10 (×3): 2 mg via INTRAVENOUS
  Filled 2020-02-09 (×3): qty 1
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
## 2020-02-09 NOTE — Progress Notes (Signed)
Subjective: Patient continues to report pain in his right lower leg. He is tolerating diet.  Urinating.  +Flatus.  No BM.  Up to bathroom with walker independently. He is ready to be discharged from the hospital. [facility] team spoke to him about substance abuse facility, but patient does not think he will be successful there. He is interested in a rehab facility after discharge, but does not know if he can get approved due to lack of insurance.  Patient is homeless and has no living family or place to stay.  He stays in a tent in the woods.  He is an  Alcoholic.   Objective:   VITALS:   Vitals:   02/08/20 1943 02/09/20 0014 02/09/20 0432 02/09/20 0918  BP: 123/90  116/83 110/75  Pulse: 78  72 79  Resp: 14  15 15   Temp: 98.4 F (36.9 C)  98.4 F (36.9 C) 97.8 F (36.6 C)  TempSrc: Oral  Oral Oral  SpO2: 98% 96% 98% 98%  Weight:      Height:       CBC Latest Ref Rng & Units 02/08/2020 02/07/2020 02/05/2020  WBC 4.0 - 10.5 K/uL 10.5 13.0(H) 11.7(H)  Hemoglobin 13.0 - 17.0 g/dL 11.5(L) 12.6(L) 14.1  Hematocrit 39.0 - 52.0 % 33.8(L) 37.9(L) 40.6  Platelets 150 - 400 K/uL 173 199 264   BMP Latest Ref Rng & Units 02/08/2020 02/07/2020 02/06/2020  Glucose 70 - 99 mg/dL 100(H) 102(H) -  BUN 6 - 20 mg/dL 7 9 -  Creatinine 0.61 - 1.24 mg/dL 0.87 0.92 0.79  Sodium 135 - 145 mmol/L 136 138 -  Potassium 3.5 - 5.1 mmol/L 3.9 4.2 -  Chloride 98 - 111 mmol/L 102 99 -  CO2 22 - 32 mmol/L 26 28 -  Calcium 8.9 - 10.3 mg/dL 8.9 9.1 -   Intake/Output      05/31 0701 - 06/01 0700 06/01 0701 - 06/02 0700   P.O. 480    Total Intake(mL/kg) 480 (6.1)    Urine (mL/kg/hr) 2100 (1.1) 600 (1.4)   Total Output 2100 600   Net -1620 -600           Physical Exam: General: NAD.  Upright in bed.  Calm, conversant. Resp: No increased wob Cardio: regular rate and rhythm ABD soft Neurologically intact MSK RLE: splint CDI, +EHL though remainder of motor difficult to test due to splint, sensation intact  distally with warm well perfused foot, no pain w passive stretch  Assessment: 3 Days Post-Op  S/P Procedure(s) (LRB): INTRAMEDULLARY (IM) NAIL TIBIAL (Right) by Dr. Griffin Basil on 02/06/2020  Active Problems:   Displaced comminuted fracture of shaft of right tibia, initial encounter for closed fracture   Right segmental tibia fracture Stable status post IM nail Early mobilization  Alcoholism, substance abuse Continue CIWA.  Tobacco abuse Cessation discussed including risks of same perioperatively  Homelessness TOC team consult pending  Plan: Up with therapy Incentive Spirometry Elevate  Social issues: Homeless without social support. TOC team consult pending. SNF recommended by PT  Weightbearing: NWB RLE Insicional and dressing care: Dressings left intact until follow-up Orthopedic device(s): Splint Showering: Keep dressing dry VTE prophylaxis: Lovenox while inpatient.  81 mg aspirin twice daily for 6 weeks after discharge, ambulation Pain control: Elevate.  Tylenol, Celebrex, Gabapentin.  Wean narcotics. Will discontinue IV narcotics.  Follow - up plan: 1 week in the office for incisional check and x-ray.  Dispo: Still TBD.   TOC team consulted.  Patient  homeless without social support - May need SNF.    Vernetta Honey, PA-C 02/09/2020, 12:33 PM

## 2020-02-09 NOTE — TOC CAGE-AID Note (Signed)
Transition of Care [facility]) - CAGE-AID Screening   Patient Details  Name: Michael Cordova MRN: [id number] Date of Birth: Jul 09, 1981  Transition of Care [facility]) CM/SW Contact:    Jimmy Picket, LCSWA Phone Number: 02/09/2020, 11:04 AM   Clinical Narrative:  Pt states he drinks about a case of beer daily. Pt states that he uses cocaine and THC daily. Pt reports that he knows he has a problem and needs treatment. Pt stated he has been in and out of AA for 15 years and has had periods of sobriety. Pt reports he has been to multiple SA treatment facilities in the area. Pt states he lacks a supportive environment to stay sober.  Pt states he would like to return to treatment and get his life together.   CSW and pt discussed treatment options. Pt states he would like to go to another residential facility but with his injuries he doesn't know if they will accept him. CSW informed pt that most facilities the pt will have to be able to care for self and complete ADL's. Pt stated he is unsure what he wants to do as of yet.  CSW ill check back in with pt.   CAGE-AID Screening:    Have You Ever Felt You Ought to Cut Down on Your Drinking or Drug Use?: Yes Have People Annoyed You By Critizing Your Drinking Or Drug Use?: Yes Have You Felt Bad Or Guilty About Your Drinking Or Drug Use?: Yes Have You Ever Had a Drink or Used Drugs First Thing In The Morning to STeady Your Nerves or to Get Rid of a Hangover?: Yes CAGE-AID Score: 4  Substance Abuse Education Offered: Yes  Substance abuse interventions: Patient Counseling, Educational Materials  Jimmy Picket, Theresia Majors, [facility] Clinical Social Worker [phone]
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
## 2020-02-10 ENCOUNTER — Other Ambulatory Visit: Payer: Self-pay

## 2020-02-10 ENCOUNTER — Emergency Department (HOSPITAL_COMMUNITY)
Admission: EM | Admit: 2020-02-10 | Discharge: 2020-02-11 | Disposition: A | Discharge status: Home / Self Care | Payer: Self-pay | Attending: Emergency Medicine | Admitting: Emergency Medicine

## 2020-02-10 DIAGNOSIS — Z8781 Personal history of (healed) traumatic fracture: Secondary | ICD-10-CM

## 2020-02-10 DIAGNOSIS — M79604 Pain in right leg: Secondary | ICD-10-CM | POA: Insufficient documentation

## 2020-02-10 DIAGNOSIS — S82261D Displaced segmental fracture of shaft of right tibia, subsequent encounter for closed fracture with routine healing: Secondary | ICD-10-CM | POA: Insufficient documentation

## 2020-02-10 DIAGNOSIS — Z9889 Other specified postprocedural states: Secondary | ICD-10-CM | POA: Insufficient documentation

## 2020-02-10 LAB — BASIC METABOLIC PANEL
Anion gap: 9 (ref 5–15)
BUN: 13 mg/dL (ref 6–20)
CO2: 26 mmol/L (ref 22–32)
Calcium: 8.9 mg/dL (ref 8.9–10.3)
Chloride: 105 mmol/L (ref 98–111)
Creatinine, Ser: 0.95 mg/dL (ref 0.61–1.24)
GFR calc Af Amer: >60 mL/min (ref >60)
GFR calc non Af Amer: >60 mL/min (ref >60)
Glucose, Bld: 107 mg/dL — ABNORMAL HIGH (ref 70–99)
Potassium: 4.4 mmol/L (ref 3.5–5.1)
Sodium: 140 mmol/L (ref 135–145)

## 2020-02-10 LAB — CBC
HCT: 34.2 % — ABNORMAL LOW (ref 39.0–52.0)
Hemoglobin: 11.5 g/dL — ABNORMAL LOW (ref 13.0–17.0)
MCH: 33 pg (ref 26.0–34.0)
MCHC: 33.6 g/dL (ref 30.0–36.0)
MCV: 98.3 fL (ref 80.0–100.0)
Platelets: 191 10*3/uL (ref 150–400)
RBC: 3.48 MIL/uL — ABNORMAL LOW (ref 4.22–5.81)
RDW: 12.5 % (ref 11.5–15.5)
WBC: 9.6 10*3/uL (ref 4.0–10.5)
nRBC: 0 % (ref 0.0–0.2)

## 2020-02-10 NOTE — Progress Notes (Signed)
I was made aware by the day nurse of the patient stating he wanted to leave AMA.  Michael Cordova talked to the patient at length about his use of pain medications and the fact that typically patients are off narcotics POD1 after this surgery.  He reported to the RN he would leave AMA if not restarting IV pain medications.  I do not feel its medically appropriate to use IV pain medications at this point in my experience taking care of similar patients previously.    The patient requested a wheelchair which we think would be reasonable for protecting his extremity but we were advised that pain medicines were not able to be prescribed due to him potentially leaving AMA.  I would recommend strongly aspirin 81mg  BID to prevent DVT if at all possible.
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
## 2020-02-10 NOTE — Progress Notes (Signed)
Subjective: Patient continues to report pain in his right lower leg. He is tolerating diet.  Urinating.  +Flatus. He denies any chest pain, shortness of breath, nausea, vomiting, or abdominal pain. He is ready to be discharged from the hospital. [facility] team spoke to him about substance abuse facility, but patient does not think he will be successful there. He is interested in a rehab facility after discharge, but he does not have any insurance.   Patient is homeless and has no living family or place to stay.  He [place] the woods.  He is an  Alcoholic.     Objective:   VITALS:   Vitals:   02/09/20 1430 02/09/20 2050 02/10/20 0545 02/10/20 0712  BP: 131/87 115/81 111/78 115/80  Pulse: 87 71 64 77  Resp: 16 16 16 16   Temp: 98.2 F (36.8 C) (!) 97.5 F (36.4 C) 97.8 F (36.6 C) (!) 97.5 F (36.4 C)  TempSrc: Oral Oral Oral Oral  SpO2: 100% 98% 98% 98%  Weight:      Height:       CBC Latest Ref Rng & Units 02/10/2020 02/08/2020 02/07/2020  WBC 4.0 - 10.5 K/uL 9.6 10.5 13.0(H)  Hemoglobin 13.0 - 17.0 g/dL 11.5(L) 11.5(L) 12.6(L)  Hematocrit 39.0 - 52.0 % 34.2(L) 33.8(L) 37.9(L)  Platelets 150 - 400 K/uL 191 173 199   BMP Latest Ref Rng & Units 02/10/2020 02/08/2020 02/07/2020  Glucose 70 - 99 mg/dL 107(H) 100(H) 102(H)  BUN 6 - 20 mg/dL 13 7 9   Creatinine 0.61 - 1.24 mg/dL 0.95 0.87 0.92  Sodium 135 - 145 mmol/L 140 136 138  Potassium 3.5 - 5.1 mmol/L 4.4 3.9 4.2  Chloride 98 - 111 mmol/L 105 102 99  CO2 22 - 32 mmol/L 26 26 28   Calcium 8.9 - 10.3 mg/dL 8.9 8.9 9.1   Intake/Output      06/01 0701 - 06/02 0700 06/02 0701 - 06/03 0700   P.O.     Total Intake(mL/kg)     Urine (mL/kg/hr) 1200 (0.6)    Total Output 1200    Net -1200            Physical Exam: General: NAD.  Upright in bed.  No acute distress Resp: No increased wob Cardio: regular rate and rhythm ABD soft, non-tender to palpation Neurologically intact MSK RLE: splint CDI, +EHL though remainder of  motor difficult to test due to splint, sensation intact distally with warm well perfused foot, no pain w passive stretch. Calf compressible around splint.   Assessment: 4 Days Post-Op  S/P Procedure(s) (LRB): INTRAMEDULLARY (IM) NAIL TIBIAL (Right) by Dr. Griffin Basil on 02/06/2020  Active Problems:   Displaced comminuted fracture of shaft of right tibia, initial encounter for closed fracture   Right segmental tibia fracture Stable status post IM tibial nail on 5/29 by Dr. Griffin Basil  POD#5 Early mobilization  Alcoholism, substance abuse Continue CIWA.  Tobacco abuse Cessation discussed including risks of same perioperatively  Homelessness TOC team following. PT/OT recommending SNF. Patient must be approved for medicaid prior to dispo to SNF, due to his lack of insurance. TOC has discussed SA treatment facilities, but patient does not believe they will do him any good.   Plan: Up with therapy Incentive Spirometry Elevate  Social issues: Homeless without social support. TOC team following SNF recommended by PT  Weightbearing: NWB RLE Insicional and dressing care: Dressings left intact until follow-up Orthopedic device(s): Splint Showering: Keep dressing dry VTE prophylaxis:  Lovenox while inpatient.  81 mg aspirin twice daily for 6 weeks after discharge, ambulation Pain control: Elevate.  Tylenol, Celebrex, Gabapentin.  Wean narcotics. Will discontinue IV narcotics.  Follow - up plan: 1 week in the office for incisional check and x-ray.  Dispo: Still TBD.   TOC team following.  Patient homeless without social support - May need SNF or discharge to homeless shelter.   Patient is cleared for discharge from an orthopedic standpoint as well as medically. However, due to his unsafe home situation and lack of social support he remains in the hospital. [facility] team following.   Had a long discussion with the patient this morning about weaning off narcotics. We discussed we will discontinue IV  narcotics and continue only oral narcotics at this time. Due to his history of substance abuse, loss of multiple friends/family members due to opioids, and when he will be discharged he will not have IV narcotics, we discussed the importance of stopping IV narcotics. We discussed multi-modal pain control methods at length. We will continue oral narcotics, but begin to minimize usage. We also discussed his social situation at length. Due to his lack of insurance, he will likely have to get approved for Medicaid prior to a discharge to SNF. TOC team following. Disposition continue to be undetermined.   Vernetta Honey, PA-C 02/10/2020, 8:18 AM

## 2020-02-10 NOTE — Progress Notes (Signed)
    Durable Medical Equipment  (From admission, onward)         Start     Ordered   02/10/20 1241  For home use only DME lightweight manual wheelchair with seat cushion  Once    Comments: Patient suffers from R tibia repair which impairs their ability to perform daily activities like walking in the home. A rolling will not resolve issue with performing activities of daily living. A wheelchair will allow patient to safely perform daily activities. Patient is not able to propel themselves in the home using a standard weight wheelchair due to weakness. Patient can self propel in the lightweight wheelchair. Length of need 12 months Accessories: elevating leg rests (ELRs), wheel locks, extensions and anti-tippers.   02/10/20 1249

## 2020-02-10 NOTE — Progress Notes (Signed)
Pt left AMA; pt educated about the risks & benefits of leaving AMA but still he opted to leave;Asst DON, Primary MD and PA-C were aware of the situation.

## 2020-02-10 NOTE — ED Triage Notes (Signed)
Pt BIB [facility] for eval of R leg pain. [place] [address] [place]28 and left AMA. Pt arrives today for c/o R leg pain, fall ETOH.

## 2020-02-11 ENCOUNTER — Other Ambulatory Visit: Payer: Self-pay

## 2020-02-11 MED ORDER — OXYCODONE-ACETAMINOPHEN 5-325 MG PO TABS
2.0000 | ORAL_TABLET | Freq: Once | ORAL | Status: AC
  Administered 2020-02-11: 2 via ORAL
  Filled 2020-02-11: qty 2

## 2020-02-11 MED ORDER — HYDROCODONE-ACETAMINOPHEN 5-325 MG PO TABS: 1.0000 | ORAL_TABLET | Freq: Four times a day (QID) | ORAL | 0 refills | Status: DC | PRN

## 2020-02-11 NOTE — ED Notes (Signed)
The pt expressed the need to use the restroom.  This tech instructed the pt that he could use his walker and go to the restroom as he had been using it to move all over the waiting room and go outside without a problem.  The pt ambulated approx. half way across the waiting room and got his walker hung up in a chair.  At this point I heard him trying to free it up and looked up just in time to see him jerk the walker and fall to his left side. The pt had no apparent injuries, was moving all extremities and was A&O x4. Allison-NT arrived to assist. After lying there for a moment he began to try to get up on his own, refusing help and stating " you don't know what the hell your doing. Ya'll aint worth shit".  After failing to be able to get himself up, he allowed [place] to help him. Myself and Michael-NT escorted him to the restroom as he successfully used his walker to ambulate to the bathroom, then back to his chair without a problem.

## 2020-02-11 NOTE — ED Notes (Signed)
Patient being uncooperative, removing bandage on RIGHT leg in waiting room, cursing at staff and throwing urine specimen cup. Patient c/o severe leg pain. Patient encouraged to sit back in chair to elevate injured extremity. Patient refuses to rest and continues to get up and walk throughout the waiting room with walker. Triage RN aware.

## 2020-02-11 NOTE — ED Notes (Signed)
Pt getting up multiple times to smoke outside and making a scene by falling on floor to get attention. Pt stating he is in extreme pain however keeps on walking on injured leg. Pt verbally abusive towards staff and disturbing the waiting room. Pt also attempted to remove bandage on leg. This tech wrapped leg back up with ACE bandage.

## 2020-02-11 NOTE — Discharge Instructions (Addendum)
It was our pleasure to provide your ER care today - we hope that you feel better.  Take motrin or aleve as need for pain. You may also take hydrocodone as need for pain. No driving when taking hydrocodone. Also, do not take tylenol or acetaminophen containing medication when taking hydrocodone.   No driving until cleared to do so by your doctor/orthopedist.   Take a baby aspirin 2x/day.   Elevate leg. Keep area very clean and dry. Use crutches/walker - no weight bearing on right leg until cleared to do so by your doctor.   Follow up with your doctor this week as planned.  Return to ER if worse, new symptoms, severe or intractable pain, numbness/weakness, infection of wound/spreading redness/pus, fevers, or other concern.

## 2020-02-11 NOTE — ED Notes (Signed)
Pt visualized going outside with his walker.  Other personal items remain inside.
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
## 2020-02-11 NOTE — Progress Notes (Signed)
Orthopedic Tech Progress Note Patient Details:  Michael Cordova 1981/05/19 [id number]  Ortho Devices Type of Ortho Device: Ace wrap Ortho Device/Splint Location: Right Lower Extremity Ortho Device/Splint Interventions: Ordered, Application, Adjustment   Post Interventions Patient Tolerated: Well Instructions Provided: Adjustment of device, Care of device, Poper ambulation with device   Splint was in good condition, reapplied ace bandage to right long leg splint per nurse's request .   Gerald Stabs 02/11/2020, 9:17 AM

## 2020-02-12 ENCOUNTER — Encounter (HOSPITAL_COMMUNITY): Payer: Self-pay

## 2020-02-14 ENCOUNTER — Emergency Department (HOSPITAL_COMMUNITY)
Admission: EM | Admit: 2020-02-14 | Discharge: 2020-02-15 | Disposition: A | Discharge status: Home / Self Care | Payer: Self-pay | Attending: Emergency Medicine | Admitting: Emergency Medicine

## 2020-02-14 ENCOUNTER — Other Ambulatory Visit: Payer: Self-pay

## 2020-02-14 ENCOUNTER — Encounter (HOSPITAL_COMMUNITY): Payer: Self-pay | Admitting: Emergency Medicine

## 2020-02-14 DIAGNOSIS — Z590 Homelessness: Secondary | ICD-10-CM | POA: Insufficient documentation

## 2020-02-14 DIAGNOSIS — Z79899 Other long term (current) drug therapy: Secondary | ICD-10-CM | POA: Insufficient documentation

## 2020-02-14 DIAGNOSIS — Z4789 Encounter for other orthopedic aftercare: Secondary | ICD-10-CM | POA: Insufficient documentation

## 2020-02-14 DIAGNOSIS — F319 Bipolar disorder, unspecified: Secondary | ICD-10-CM | POA: Insufficient documentation

## 2020-02-14 DIAGNOSIS — F17210 Nicotine dependence, cigarettes, uncomplicated: Secondary | ICD-10-CM | POA: Insufficient documentation

## 2020-02-14 NOTE — ED Triage Notes (Signed)
Patient arrives to ED with complaints of leg pain. Patient has hx of Tibia fracture. Patient states he is homeless and needs help wrapping his leg. Patient is intoxicate din triage. Patient states he's been doing Meth for his pain control.

## 2020-02-14 NOTE — ED Notes (Signed)
Pt comes to nurse first and states he has been drinking ETOH and smoking weed to help with pain.  States he is out of both.  PT proceeds to start unwrapping splint on R leg.  Informed pt that he should leave splint in place until he is in treatment room.  He continues to remove it and states he wants it off now.
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
## 2020-02-14 NOTE — ED Notes (Signed)
Pt is outside

## 2020-02-14 NOTE — Progress Notes (Signed)
Orthopedic Tech Progress Note Patient Details:  Michael Cordova Jan 28, 1981 [id number]  Ortho Devices Type of Ortho Device: Post (short leg) splint Ortho Device/Splint Location: rle. posterior short leg splint applied at drs request. Ortho Device/Splint Interventions: Ordered, Application, Adjustment   Post Interventions Patient Tolerated: Well Instructions Provided: Care of device, Adjustment of device   Trinna Post 02/14/2020, 8:26 PM

## 2020-02-14 NOTE — ED Notes (Signed)
Pt removed splint

## 2020-02-15 ENCOUNTER — Other Ambulatory Visit: Payer: Self-pay

## 2020-02-15 ENCOUNTER — Encounter (HOSPITAL_COMMUNITY): Payer: Self-pay

## 2020-02-15 ENCOUNTER — Emergency Department (HOSPITAL_COMMUNITY)
Admission: EM | Admit: 2020-02-15 | Discharge: 2020-02-16 | Disposition: A | Discharge status: Home / Self Care | Payer: Self-pay | Attending: Emergency Medicine | Admitting: Emergency Medicine

## 2020-02-15 DIAGNOSIS — Z5321 Procedure and treatment not carried out due to patient leaving prior to being seen by health care provider: Secondary | ICD-10-CM | POA: Insufficient documentation
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
## 2020-02-16 NOTE — ED Notes (Signed)
Pt called for vitals x3. No answer 

## 2020-03-01 ENCOUNTER — Emergency Department (HOSPITAL_COMMUNITY)
Admission: EM | Admit: 2020-03-01 | Discharge: 2020-03-01 | Disposition: A | Discharge status: Home / Self Care | Payer: Self-pay | Attending: Emergency Medicine | Admitting: Emergency Medicine

## 2020-03-01 DIAGNOSIS — Z5321 Procedure and treatment not carried out due to patient leaving prior to being seen by health care provider: Secondary | ICD-10-CM | POA: Insufficient documentation

## 2020-03-01 DIAGNOSIS — S81001D Unspecified open wound, right knee, subsequent encounter: Secondary | ICD-10-CM | POA: Insufficient documentation

## 2020-03-01 DIAGNOSIS — X58XXXD Exposure to other specified factors, subsequent encounter: Secondary | ICD-10-CM | POA: Insufficient documentation
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
## 2020-03-20 ENCOUNTER — Emergency Department (HOSPITAL_COMMUNITY)
Admission: EM | Admit: 2020-03-20 | Discharge: 2020-03-20 | Disposition: A | Discharge status: Home / Self Care | Payer: Self-pay | Attending: Emergency Medicine | Admitting: Emergency Medicine

## 2020-03-20 ENCOUNTER — Encounter (HOSPITAL_COMMUNITY): Payer: Self-pay | Admitting: Emergency Medicine

## 2020-03-20 ENCOUNTER — Other Ambulatory Visit: Payer: Self-pay

## 2020-03-20 DIAGNOSIS — M79604 Pain in right leg: Secondary | ICD-10-CM | POA: Insufficient documentation

## 2020-03-20 DIAGNOSIS — F17210 Nicotine dependence, cigarettes, uncomplicated: Secondary | ICD-10-CM | POA: Insufficient documentation

## 2020-03-20 MED ORDER — NAPROXEN 500 MG PO TABS: 500.0000 mg | ORAL_TABLET | Freq: Two times a day (BID) | ORAL | 0 refills | Status: DC

## 2020-03-20 NOTE — Discharge Instructions (Signed)
Take the prescribed medication as directed.  If leg starts swelling worse, try to keep it elevated. Follow-up with Dr. Everardo Pacific-- I have attached his office information for you.  I would call in the morning to get an appointment scheduled. Return to the ED for new or worsening symptoms.
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
## 2020-03-20 NOTE — ED Notes (Signed)
Patient verbalizes understanding of discharge instructions. Opportunity for questioning and answers were provided. Armband removed by staff, pt discharged from ED. Pt. ambulatory and discharged home.  

## 2020-04-12 ENCOUNTER — Other Ambulatory Visit: Payer: Self-pay
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
## 2020-04-14 ENCOUNTER — Ambulatory Visit: Payer: Self-pay

## 2020-04-14 ENCOUNTER — Emergency Department (HOSPITAL_COMMUNITY): Payer: 59

## 2020-04-14 ENCOUNTER — Encounter (HOSPITAL_COMMUNITY): Payer: Self-pay

## 2020-04-14 ENCOUNTER — Emergency Department (HOSPITAL_COMMUNITY)
Admission: EM | Admit: 2020-04-14 | Discharge: 2020-04-15 | Disposition: A | Discharge status: Home / Self Care | Payer: 59 | Attending: Emergency Medicine | Admitting: Emergency Medicine

## 2020-04-14 DIAGNOSIS — F17210 Nicotine dependence, cigarettes, uncomplicated: Secondary | ICD-10-CM | POA: Diagnosis not present

## 2020-04-14 DIAGNOSIS — Y999 Unspecified external cause status: Secondary | ICD-10-CM | POA: Insufficient documentation

## 2020-04-14 DIAGNOSIS — Y9389 Activity, other specified: Secondary | ICD-10-CM | POA: Diagnosis not present

## 2020-04-14 DIAGNOSIS — Y9289 Other specified places as the place of occurrence of the external cause: Secondary | ICD-10-CM | POA: Diagnosis not present

## 2020-04-14 DIAGNOSIS — S5001XA Contusion of right elbow, initial encounter: Secondary | ICD-10-CM | POA: Diagnosis not present

## 2020-04-14 DIAGNOSIS — F10239 Alcohol dependence with withdrawal, unspecified: Secondary | ICD-10-CM | POA: Insufficient documentation

## 2020-04-14 DIAGNOSIS — S59901A Unspecified injury of right elbow, initial encounter: Secondary | ICD-10-CM | POA: Diagnosis present

## 2020-04-14 DIAGNOSIS — W050XXA Fall from non-moving wheelchair, initial encounter: Secondary | ICD-10-CM | POA: Insufficient documentation

## 2020-04-14 DIAGNOSIS — F10930 Alcohol use, unspecified with withdrawal, uncomplicated: Secondary | ICD-10-CM
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
## 2020-04-15 ENCOUNTER — Ambulatory Visit: Payer: Self-pay

## 2020-04-15 LAB — CBC WITH DIFFERENTIAL/PLATELET
Abs Immature Granulocytes: 0.04 10*3/uL (ref 0.00–0.07)
Basophils Absolute: 0.1 10*3/uL (ref 0.0–0.1)
Basophils Relative: 2 %
Eosinophils Absolute: 0.1 10*3/uL (ref 0.0–0.5)
Eosinophils Relative: 2 %
HCT: 41.4 % (ref 39.0–52.0)
Hemoglobin: 14.3 g/dL (ref 13.0–17.0)
Immature Granulocytes: 1 %
Lymphocytes Relative: 18 %
Lymphs Abs: 0.9 10*3/uL (ref 0.7–4.0)
MCH: 34.1 pg — ABNORMAL HIGH (ref 26.0–34.0)
MCHC: 34.5 g/dL (ref 30.0–36.0)
MCV: 98.8 fL (ref 80.0–100.0)
Monocytes Absolute: 0.5 10*3/uL (ref 0.1–1.0)
Monocytes Relative: 10 %
Neutro Abs: 3.4 10*3/uL (ref 1.7–7.7)
Neutrophils Relative %: 67 %
Platelets: 120 10*3/uL — ABNORMAL LOW (ref 150–400)
RBC: 4.19 MIL/uL — ABNORMAL LOW (ref 4.22–5.81)
RDW: 13.8 % (ref 11.5–15.5)
WBC: 5 10*3/uL (ref 4.0–10.5)
nRBC: 0 % (ref 0.0–0.2)

## 2020-04-15 LAB — COMPREHENSIVE METABOLIC PANEL
ALT: 41 U/L (ref 0–44)
AST: 89 U/L — ABNORMAL HIGH (ref 15–41)
Albumin: 3.9 g/dL (ref 3.5–5.0)
Alkaline Phosphatase: 71 U/L (ref 38–126)
Anion gap: 13 (ref 5–15)
BUN: <5 mg/dL — ABNORMAL LOW (ref 6–20)
CO2: 22 mmol/L (ref 22–32)
Calcium: 9.2 mg/dL (ref 8.9–10.3)
Chloride: 103 mmol/L (ref 98–111)
Creatinine, Ser: 0.9 mg/dL (ref 0.61–1.24)
GFR calc Af Amer: >60 mL/min (ref >60)
GFR calc non Af Amer: >60 mL/min (ref >60)
Glucose, Bld: 92 mg/dL (ref 70–99)
Potassium: 3.7 mmol/L (ref 3.5–5.1)
Sodium: 138 mmol/L (ref 135–145)
Total Bilirubin: 0.9 mg/dL (ref 0.3–1.2)
Total Protein: 6.6 g/dL (ref 6.5–8.1)

## 2020-04-15 LAB — ETHANOL: Alcohol, Ethyl (B): <10 mg/dL (ref <10)

## 2020-04-15 MED ORDER — CHLORDIAZEPOXIDE HCL 25 MG PO CAPS: ORAL_CAPSULE | ORAL | 0 refills | Status: DC

## 2020-04-15 MED ORDER — SODIUM CHLORIDE 0.9 % IV BOLUS
1000.0000 mL | Freq: Once | INTRAVENOUS | Status: AC
  Administered 2020-04-15: 1000 mL via INTRAVENOUS

## 2020-04-15 MED ORDER — LORAZEPAM 2 MG/ML IJ SOLN
2.0000 mg | Freq: Once | INTRAMUSCULAR | Status: AC
  Administered 2020-04-15: 2 mg via INTRAVENOUS
  Filled 2020-04-15: qty 1

## 2020-04-15 MED FILL — CHLORDIAZEPOXIDE 25 MG CAP: 25 | 3 days supply | Qty: 10 | Fill #0

## 2020-04-15 NOTE — Discharge Planning (Signed)
RNCM consulted regarding homeless, insured pt needing medication assistance.  RNCM advised to have RX sent to [facility] and follow up with [facility]) medical team for consistent medical care and Rx needs. EDCM contacted Lavinia Sharps, NP with [facility] to alert her of pt arrival.

## 2020-04-15 NOTE — Discharge Instructions (Addendum)
You were seen in the ED for right elbow pain.  We think the swelling is from a contusion or hematoma (collection of blood).  No signs of infection.  Return to the ED for increased swelling, redness, warmth, fevers, pain with movement of the elbow  You were given medicine here to manage your withdrawal symptoms.  Please take Librium taper as prescribed.  Please go to [facility] to pick this up.

## 2020-04-15 NOTE — ED Provider Notes (Signed)
[facility] EMERGENCY DEPARTMENT Provider Note   CSN: [id number] Arrival date & time: 04/14/20  2204     History Chief Complaint  Patient presents with   Wound Check    Michael Cordova is a 39 y.o. male with history of homelessness, alcohol use, polysubstance abuse presents to the ED for evaluation of right elbow pain.  States patient states yesterday he fell out of his wheelchair and landed on his right elbow.  States when he woke up he noticed the pain there associated with swelling.  Continues to drink heavily usually at least 24 beers a day.  Denies any right-sided arm tingling or numbness.  Denies any other injuries.  Patient has been in the ED for more than 12 hours and states he feels like he is going through withdrawals now and reports nausea, abdominal pain, headache, tremors.  States he would like to be detoxed here.  Denies any recent IV drug use.  No interventions.  No other associated symptoms.  HPI     Past Medical History:  Diagnosis Date   Alcohol abuse    Bipolar 2 disorder (HCC)    Manic depression (HCC)    PTSD (post-traumatic stress disorder)     Patient Active Problem List   Diagnosis Date Noted   Displaced comminuted fracture of shaft of right tibia, initial encounter for closed fracture 02/05/2020   Mood disorder in conditions classified elsewhere    Alcohol use disorder, severe, dependence (HCC) 08/29/2016    Past Surgical History:  Procedure Laterality Date   APPENDECTOMY     TIBIA IM NAIL INSERTION Right 02/06/2020   Procedure: INTRAMEDULLARY (IM) NAIL TIBIAL;  Surgeon: Bjorn Pippin, MD;  Location: [facility] OR;  Service: Orthopedics;  Laterality: Right;       History reviewed. No pertinent family history.  Social History   Tobacco Use   Smoking status: Current Every Day Smoker    Packs/day: 1.50    Years: 7.00    Pack years: 10.50    Types: Cigarettes   Smokeless tobacco: Never Used  Building services engineer Use: Never  used  Substance Use Topics   Alcohol use: Yes    Comment: drinks 2- fifths of liquor daily   Drug use: Yes    Types: Cocaine, Marijuana, Methamphetamines    Home Medications Prior to Admission medications   Medication Sig Start Date End Date Taking? Authorizing Provider  acamprosate (CAMPRAL) 333 MG tablet Take 2 tablets (666 mg total) by mouth 3 (three) times daily with meals. Patient not taking: Reported on 12/23/2019 09/04/16   Beau Fanny, FNP  acetaminophen (TYLENOL 8 HOUR) 650 MG CR tablet Take 1 tablet (650 mg total) by mouth every 8 (eight) hours as needed. Patient not taking: Reported on 12/23/2019 05/11/19   Derwood Kaplan, MD  albuterol (PROVENTIL HFA;VENTOLIN HFA) 108 (90 Base) MCG/ACT inhaler Inhale 1-2 puffs into the lungs every 4 (four) hours as needed for wheezing or shortness of breath. Patient not taking: Reported on 12/23/2019 09/04/16   Withrow, Everardo All, FNP  cephALEXin (KEFLEX) 250 MG capsule Take 1 capsule (250 mg total) by mouth 4 (four) times daily. Patient not taking: Reported on 12/23/2019 09/20/16   Geoffery Lyons, MD  chlordiazePOXIDE (LIBRIUM) 25 MG capsule 50mg  PO TID x 1D, then 25-50mg  PO BID X 1D, then 25-50mg  PO QD X 1D 04/15/20   06/15/20, PA-C  ciprofloxacin-dexamethasone (CIPRODEX) OTIC suspension Place 4 drops into the right ear 2 (two)  times daily. 05/11/19   Derwood KaplanNanavati, Ankit, MD  citalopram (CELEXA) 20 MG tablet Take 1 tablet (20 mg total) by mouth daily. 09/05/16   Withrow, Everardo AllJohn C, FNP  doxycycline (VIBRA-TABS) 100 MG tablet Take 1 tablet (100 mg total) by mouth 2 (two) times daily. 01/17/20   Linwood DibblesKnapp, Jon, MD  etodolac (LODINE) 300 MG capsule Take 1 capsule (300 mg total) by mouth every 8 (eight) hours. 01/17/20   Linwood DibblesKnapp, Jon, MD  gabapentin (NEURONTIN) 300 MG capsule Take 1 capsule (300 mg total) by mouth 3 (three) times daily. 09/04/16   Withrow, Everardo AllJohn C, FNP  HYDROcodone-acetaminophen (NORCO/VICODIN) 5-325 MG tablet Take 1-2 tablets by mouth every 6  (six) hours as needed for moderate pain. 02/11/20   Cathren LaineSteinl, Kevin, MD  hydrOXYzine (ATARAX/VISTARIL) 25 MG tablet Take 1 tablet (25 mg total) by mouth every 6 (six) hours as needed for anxiety. 09/04/16   Withrow, Everardo AllJohn C, FNP  naproxen (NAPROSYN) 500 MG tablet Take 1 tablet (500 mg total) by mouth 2 (two) times daily with a meal. 03/20/20   Garlon HatchetSanders, Lisa M, PA-C  neomycin-bacitracin-polymyxin (NEOSPORIN) ointment Apply 1 application topically every 12 (twelve) hours. 12/13/19   Derwood KaplanNanavati, Ankit, MD  neomycin-bacitracin-polymyxin (NEOSPORIN) ointment Apply 1 application topically every 12 (twelve) hours. 12/23/19   Mannie StabileAberman, Caroline C, PA-C  nicotine polacrilex (NICORETTE) 2 MG gum Take 1 each (2 mg total) by mouth as needed for smoking cessation. 09/04/16   Withrow, Everardo AllJohn C, FNP  oxyCODONE (OXY IR/ROXICODONE) 5 MG immediate release tablet Take 1 pills every 4-6 hrs as needed for pain 02/06/20   Vernetta HoneyMcBane, Caroline N, PA-C  tamsulosin (FLOMAX) 0.4 MG CAPS capsule Take 1 capsule (0.4 mg total) by mouth daily. 05/11/19   Derwood KaplanNanavati, Ankit, MD  thiamine 100 MG tablet Take 1 tablet (100 mg total) by mouth daily. 09/05/16   Withrow, Everardo AllJohn C, FNP  traZODone (DESYREL) 50 MG tablet Take 1 tablet (50 mg total) by mouth at bedtime. 09/04/16   Withrow, Everardo AllJohn C, FNP    Allergies    Penicillins and Penicillins  Review of Systems   Review of Systems  Musculoskeletal: Positive for arthralgias and joint swelling.  All other systems reviewed and are negative.   Physical Exam Updated Vital Signs BP 122/88 (BP Location: Left Arm)    Pulse 86    Temp 98 F (36.7 C) (Oral)    Resp 18    Ht 5\' 9"  (1.753 m)    Wt 79.4 kg    SpO2 96%    BMI 25.84 kg/m   Physical Exam Vitals and nursing note reviewed.  Constitutional:      General: He is not in acute distress.    Appearance: He is well-developed.     Comments: NAD.  HENT:     Head: Normocephalic and atraumatic.     Right Ear: External ear normal.     Left Ear: External ear  normal.     Nose: Nose normal.  Eyes:     General: No scleral icterus.    Conjunctiva/sclera: Conjunctivae normal.  Cardiovascular:     Rate and Rhythm: Normal rate and regular rhythm.     Heart sounds: Normal heart sounds.     Comments: 1+ radial pulses bilaterally Pulmonary:     Effort: Pulmonary effort is normal.     Breath sounds: Normal breath sounds.  Musculoskeletal:        General: Tenderness present. No deformity. Normal range of motion.     Cervical back: Normal range  of motion and neck supple.     Comments: Very focal edema and firmness/fluctuance in the right posterior proximal forearm with small area of ecchymosis.  Minimally tender.  Full range of motion of the elbow including flexion, extension and pronation without any pain.  No other focal tenderness in the right forearm, wrist or hand.  Skin:    General: Skin is warm and dry.     Capillary Refill: Capillary refill takes less than 2 seconds.     Comments: Ecchymosis over the right area of edema, firmness likely contusion or forming hematoma over the right posterior proximal forearm.  No overlying erythema, warmth, fluctuance, skin lesions or puncture wounds.  Neurological:     Mental Status: He is alert and oriented to person, place, and time.     Comments: Sensation in radial, medial, ulnar nerve distribution intact in the right upper extremity.  5/5 strength with handgrip in the right upper extremity.  Subtle tremors noted in the distal extremities.  Psychiatric:        Behavior: Behavior normal.        Thought Content: Thought content normal.        Judgment: Judgment normal.     ED Results / Procedures / Treatments   Labs (all labs ordered are listed, but only abnormal results are displayed) Labs Reviewed  CBC WITH DIFFERENTIAL/PLATELET - Abnormal; Notable for the following components:      Result Value   RBC 4.19 (*)    MCH 34.1 (*)    Platelets 120 (*)    All other components within normal limits   COMPREHENSIVE METABOLIC PANEL - Abnormal; Notable for the following components:   BUN <5 (*)    AST 89 (*)    All other components within normal limits  ETHANOL    EKG None  Radiology DG Elbow Complete Right  Result Date: 04/14/2020 CLINICAL DATA:  Right elbow swelling. Red swollen area posteriorly, onset tonight. Patient reports possible snake bite. EXAM: RIGHT ELBOW - COMPLETE 3+ VIEW COMPARISON:  None. FINDINGS: There is no evidence of fracture, dislocation, or joint effusion. Mild elbow joint degenerative change with spurring. No periosteal reaction or focal bone abnormality. There is focal soft tissue prominence and edema about the dorsal aspect of the proximal forearm. No soft tissue air or radiopaque foreign body. IMPRESSION: 1. Focal soft tissue prominence and edema about the dorsal aspect of the proximal forearm, nonspecific but possibly cellulitis. No soft tissue air or radiopaque foreign body. 2. Mild elbow joint degenerative change. No acute osseous abnormality. Electronically Signed   By: Narda Rutherford M.D.   On: 04/14/2020 23:41    Procedures Procedures (including critical care time)  Medications Ordered in ED Medications  sodium chloride 0.9 % bolus 1,000 mL (1,000 mLs Intravenous New Bag/Given 04/15/20 0903)  LORazepam (ATIVAN) injection 2 mg (2 mg Intravenous Given 04/15/20 1610)    ED Course  I have reviewed the triage vital signs and the nursing notes.  Pertinent labs & imaging results that were available during my care of the patient were reviewed by me and considered in my medical decision making (see chart for details).  Clinical Course as of Apr 15 1145  Fri Apr 15, 2020  9604 IMPRESSION: 1. Focal soft tissue prominence and edema about the dorsal aspect of the proximal forearm, nonspecific but possibly cellulitis. No soft tissue air or radiopaque foreign body. 2. Mild elbow joint degenerative change. No acute osseous abnormality.  DG Elbow Complete Right [CG]  Clinical Course User Index [CG] Jerrell Mylar   MDM Rules/Calculators/A&P                          Exam most consistent with hematoma/contusion given mild tenderness, very focal area of edema with overlying forming ecchymosis and previous trauma.  Course of onset of swelling and pain is not very classic of infection.  There is no overlying skin erythema, warmth, fluctuance.  Afebrile here.  Extremities neurovascularly intact.  He has full range of motion of the elbow.  Doubt septic arthritis, infectious bursitis.  Elbow x-ray ordered by triage RN, this was personally visualized and interpreted.  There is overlying soft tissue swelling but no acute osseous injury.  Bedside ultrasound performed by me shows small area of hypoechoic collection but no significant cobblestoning to suggest cellulitis.  Patient evaluated by EDP Dr. Myrtis Ser.  Will discharge with symptomatic management of hematoma.  Discussed with patient that there is risk of this becoming infected and to monitor and return for worsening swelling, redness, warmth or fevers.  He is in agreement with this.  Patient waited in the waiting room for more than 10 hours unfortunately.  He reported nausea, abdominal pain, headache and tremors that he attributes to withdrawing from alcohol.  Last drink over 12 hours ago.  Clinically, does not have signs of severe alcohol withdrawal.  Appears slightly restless but no tachycardia, hypertension.  He was given IV fluids, Ativan 2 mg and was observed in the ED.  Patient reevaluated twice and both times was asleep, feels better.  Discussed with case management who has arranged for Librium to be sent to [facility].  Patient states he will be able to go over through [facility] to pick this up.  No indication for further emergent lab work, imaging or admission.  No evidence of severe light threatening withdrawal. Symptoms essentially resolved after ativan. Return precautions discussed.  He is comfortable with  this. Final Clinical Impression(s) / ED Diagnoses Final diagnoses:  Traumatic hematoma of right elbow, initial encounter  Alcohol withdrawal syndrome without complication [facility])    Rx / DC Orders ED Discharge Orders         Ordered    chlordiazePOXIDE (LIBRIUM) 25 MG capsule     Discontinue  Reprint     04/15/20 1127           Liberty Handy, PA-C 04/15/20 1146    Sabino Donovan, MD 04/16/20 2005

## 2020-04-18 ENCOUNTER — Other Ambulatory Visit: Payer: Self-pay

## 2020-04-18 NOTE — Patient Outreach (Signed)
[facility]) Care Management  04/18/2020  Michael Cordova November 26, 1980 [id number]   04/14/2020  Difficult case discussion. Additional contact number received from [facility] [phone]. Email to York Ram to alert the social work team if patient returns to the ED. In basket message to primary MD listed in Epic.  Placed call to new above number if with no answer. Placed call to number listed in epic. Invalid number.  Placed call to Alternative number in chart which is [employer] [facility].  Left a message.   PLAN: will continue to place outreach attempts.  Rowe Pavy, RN, BSN, CEN [facility] [phone]

## 2020-04-21 ENCOUNTER — Other Ambulatory Visit: Payer: Self-pay

## 2020-04-21 NOTE — Patient Outreach (Signed)
[facility]) Care Management  04/21/2020  Michael Cordova 03/05/1981 [id number]   Telephone assessment:  Placed outreach call to patient with invalid phone number.  I have reached out to primary Md and she is attempting to reach patient as well.   PLAN: will continue outreach efforts.   Rowe Pavy, RN, BSN, CEN [facility] [phone]

## 2020-04-28 ENCOUNTER — Other Ambulatory Visit: Payer: Self-pay

## 2020-04-28 NOTE — Patient Outreach (Signed)
[facility]) Care Management  04/28/2020  Michael Cordova 07-14-1981 [id number]   Difficult case discussion: Case presented at case conference today.  Continue to be unable to reach patient.   PLAN: will continue outreach efforts.  Rowe Pavy, RN, BSN, CEN [facility] [phone]

## 2020-05-27 ENCOUNTER — Other Ambulatory Visit: Payer: Self-pay

## 2020-05-27 NOTE — Patient Outreach (Signed)
[facility]) Care Management  05/27/2020  JAQUARIOUS Cordova 1981-03-16 [id number]   Case closure:  Continue to be unsuccessful at reach patient for assessment of needs.  PLAN: will close case.  Rowe Pavy, RN, BSN, CEN [facility] [phone]
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
## 2021-02-28 ENCOUNTER — Other Ambulatory Visit: Payer: Self-pay

## 2021-02-28 ENCOUNTER — Ambulatory Visit (HOSPITAL_COMMUNITY)
Admission: EM | Admit: 2021-02-28 | Discharge: 2021-02-28 | Disposition: A | Discharge status: Home / Self Care | Payer: 59 | Attending: Psychiatry | Admitting: Psychiatry

## 2021-02-28 DIAGNOSIS — F411 Generalized anxiety disorder: Secondary | ICD-10-CM | POA: Diagnosis not present

## 2021-02-28 MED ORDER — HYDROXYZINE PAMOATE 25 MG PO CAPS: 25.0000 mg | ORAL_CAPSULE | Freq: Three times a day (TID) | ORAL | 0 refills | Status: AC | PRN

## 2021-02-28 MED ORDER — HYDROXYZINE HCL 25 MG PO TABS
25.0000 mg | ORAL_TABLET | Freq: Three times a day (TID) | ORAL | Status: DC | PRN
  Filled 2021-02-28: qty 15

## 2021-02-28 MED ORDER — TRAZODONE HCL 50 MG PO TABS
50.0000 mg | ORAL_TABLET | Freq: Every day | ORAL | Status: DC
  Filled 2021-02-28: qty 7

## 2021-02-28 MED ORDER — TRAZODONE HCL 50 MG PO TABS: 50.0000 mg | ORAL_TABLET | Freq: Every day | ORAL | 0 refills | Status: AC

## 2021-02-28 NOTE — Discharge Instructions (Addendum)

## 2021-02-28 NOTE — BH Assessment (Signed)
Pt to [facility] voluntarily requesting medications. Pt was at [facility] for ETOH detox and released on Sunday. Pt now living at [facility] in [place]. Pt reports needing Trazodone and vistaril. Pt attempted to get meds from [facility] but was told it would take him 2 weeks to see someone for meds. Pt denies SI, HI, AVH.   Pt is routine.

## 2021-02-28 NOTE — Discharge Summary (Signed)
Julian Reil to be D/C'd Home per NP order. Discussed with the patient and all questions fully answered. An After Visit Summary was printed and given to the patient. Medication samples were also given to patient. Patient escorted out and D/C home via private auto.  Dickie La  02/28/2021 12:41 PM

## 2021-02-28 NOTE — ED Provider Notes (Signed)
[facility] Medical Screening Exam  Patient Name: Michael Cordova MRN: [id number] Date of Evaluation: 02/28/21 Chief Complaint:   Diagnosis:  Final diagnoses:  Generalized anxiety disorder   Subjective   Michael Cordova is a 40 y.o. male patient presented to [facility] as a walk in alone with complaints of "I would like a prescription of trazodone and Vistaril".  Michael Cordova, 40 y.o., male patient seen face to face by this provider, consulted with Dr. Lucianne Muss; and chart reviewed on 02/28/21.     History of Present illness:   During evaluation Michael Cordova is in sitting position in no acute distress.  He makes good eye contact.  He is alert, oriented x 4, anxious and cooperative.  Patient states it is hard for him to sit still, he would like to go outside and smoke.  Patient denies depression, hopelessness, worthlessness and denies self isolating.  Denies any concerns with appetite or sleep.  He does not appear to be responding to internal/external stimuli or delusional thoughts.  Patient denies suicidal/self-harm/homicidal ideation, psychosis, and paranoia.  Patient contracts for safety.  Denies access to any firearms/weapons.  Patient answered question appropriately.    Patient states he was released from an inpatient psychiatric admission for detox from alcohol.  States he is receiving a naltrexone injection monthly.  He received it on 6/19.  States he is doing good and attending his AA groups.  States he lives in the [place] house.  States his only issue is that he is getting very anxious and he is having a hard time sleeping.  Patient states he has taken Vistaril and trazodone in the past and they were effective.  Patient is requesting medication to be prescribed today.  Discussed with patient that we do not do refills.  Explained [facility] outpatient services on the second floor.  Provided open access walk-in hours, for patient to obtain an outpatient provider that can  provide medication management.  7 day samples of Trazodone 50 MG PO QHS for sleep and anxiety and Vistaril 25 mg  PO TID PRN for anxiety given    Psychiatric Specialty Exam  Presentation  General Appearance:Appropriate for Environment; Casual  Eye Contact:Good  Speech:Clear and Coherent; Normal Rate  Speech Volume:Normal  Handedness:Right   Mood and Affect  Mood:Anxious  Affect:Congruent   Thought Process  Thought Processes:Coherent  Descriptions of Associations:Intact  Orientation:Full (Time, Place and Person)  Thought Content:Logical    Hallucinations:None  Ideas of Reference:None  Suicidal Thoughts:No  Homicidal Thoughts:No   Sensorium  Memory:Recent Good; Immediate Good; Remote Good  Judgment:Good  Insight:Good   Executive Functions  Concentration:Good  Attention Span:Good  Recall:Good  Fund of Knowledge:Good  Language:Good   Psychomotor Activity  Psychomotor Activity:Normal   Assets  Assets:Communication Skills; Desire for Improvement; Financial Resources/Insurance; Housing; Physical Health; Resilience; Vocational/Educational   Sleep  Sleep:Poor  Number of hours: 4   No data recorded  Physical Exam: Physical Exam Vitals and nursing note reviewed.  Constitutional:      Appearance: He is well-developed.  HENT:     Head: Normocephalic and atraumatic.  Eyes:     Conjunctiva/sclera: Conjunctivae normal.  Cardiovascular:     Rate and Rhythm: Normal rate and regular rhythm.     Heart sounds: No murmur heard. Pulmonary:     Effort: Pulmonary effort is normal. No respiratory distress.     Breath sounds: Normal breath sounds.  Abdominal:     Palpations: Abdomen is soft.  Tenderness: There is no abdominal tenderness.  Musculoskeletal:     Cervical back: Neck supple.  Skin:    General: Skin is warm and dry.  Neurological:     Mental Status: He is alert and oriented to person, place, and time.  Psychiatric:         Attention and Perception: Attention and perception normal.        Mood and Affect: Mood is anxious.        Speech: Speech normal.        Behavior: Behavior normal.        Thought Content: Thought content normal.        Cognition and Memory: Cognition normal.        Judgment: Judgment is impulsive.   Review of Systems  Constitutional: Negative.   HENT: Negative.    Eyes: Negative.   Respiratory: Negative.    Cardiovascular: Negative.   Gastrointestinal: Negative.   Musculoskeletal: Negative.   Skin: Negative.   Blood pressure 132/85, pulse 85, temperature 98 F (36.7 C), temperature source Oral, resp. rate 16, SpO2 100 %. There is no height or weight on file to calculate BMI.  Musculoskeletal: Strength & Muscle Tone: within normal limits Gait & Station: normal Patient leans: N/A   [facility] MSE Discharge Disposition for Follow up and Recommendations: Based on my evaluation the patient does not appear to have an emergency medical condition and can be discharged with resources and follow up care in outpatient services for Medication Management  7 day samples of Trazodone 50 MG PO QHS for sleep and anxiety and Vistaril 25 mg  PO TID PRN for anxiety given   No evidence of imminent risk to self or others at present.    Patient does not meet criteria for psychiatric inpatient admission. Discussed crisis plan, support from social network, calling 911, coming to the Emergency Department, and calling Suicide Hotline.   Ardis Hughs, NP 02/28/2021, 12:22 PM

## 2021-08-08 ENCOUNTER — Encounter: Payer: Self-pay | Admitting: Nurse Practitioner

## 2021-08-08 ENCOUNTER — Ambulatory Visit (INDEPENDENT_AMBULATORY_CARE_PROVIDER_SITE_OTHER): Payer: 59 | Admitting: Nurse Practitioner

## 2021-08-08 VITALS — BP 114/79 | HR 80 | Temp 98.6°F | Ht 69.0 in | Wt 179.0 lb

## 2021-08-08 DIAGNOSIS — Z23 Encounter for immunization: Secondary | ICD-10-CM

## 2021-08-08 DIAGNOSIS — N5082 Scrotal pain: Secondary | ICD-10-CM | POA: Diagnosis not present

## 2021-08-08 DIAGNOSIS — Z0000 Encounter for general adult medical examination without abnormal findings: Secondary | ICD-10-CM

## 2021-08-08 DIAGNOSIS — H6093 Unspecified otitis externa, bilateral: Secondary | ICD-10-CM | POA: Diagnosis not present

## 2021-08-08 MED ORDER — CIPRO HC 0.2-1 % OT SUSP: 3.0000 [drp] | Freq: Two times a day (BID) | OTIC | 0 refills | Status: AC

## 2021-08-08 NOTE — Progress Notes (Signed)
[facility] Patient [facility] [address] [place], [place]  [address] Phone:  [phone]   Fax:  [phone] Subjective:   Patient ID: Michael Cordova, male    DOB: 1981-05-27, 40 y.o.   MRN: [id number]  Chief Complaint  Patient presents with   Establish Care    Patient stated he quit drinking 5 months ago, currently living at a recovery house. States he has had testicle pain on and off past 2 years. Recent STD testing on 11/17.   HPI Michael Cordova 41 y.o. male  has a past medical history of Alcohol abuse, Bipolar 2 disorder (HCC), Manic depression (HCC), and PTSD (post-traumatic stress disorder). To the [facility] to establish care.   Patient states that he currently resides in sober living facility, [facility] and works in heating and cooling. Has been sober from alcohol x 5 mths and cocaine x 2 yrs. Attends AA daily and is participating in the 12 step program, which emphasizes managing and addressing health concerns. States, " I am getting older and have been having some issues for a while that need to get checked out."  Concerned about left testicular pain intermittently x 2 yrs. Was evaluated for symptoms previously and was informed it was residual symptoms from removal of kidney stones. States that he continues to have left testicular pain intermittently. Describes pain as dull and throbbing. Denies any worsening or improving factors. Took tylenol with no improvement. Denies any heavy lifting. Denies any dysuria. Was evaluated for STDs 1 wk ago with benign results. Denies any testicular swelling, trauma or injury.   Also complaining of right ear pain and bilateral ear itching x 1 yr. States that he has itching and pain in the inner ear intermittently. Denies any changes in hearing, drainage from ears or trauma. Denies any worsening or improving factors.   Concerned about right elbow pain x 1.5 yrs. Pain began after falling out of wheelchair. Xray's were completed post injury with  negative results. Pain increases with flexion and extension of elbow. Currently rates pain 2/10 and describes as throbbing. Has some improvement in symptoms with usage of elbow brace and NSAIDS. Denies any acute trauma or injury.   Denies any other complaints today. Denies any fever. Denies any fatigue, chest pain, shortness of breath, HA or dizziness. Denies any blurred vision, numbness or tingling.  Past Medical History:  Diagnosis Date   Alcohol abuse    Bipolar 2 disorder (HCC)    Manic depression (HCC)    PTSD (post-traumatic stress disorder)     Past Surgical History:  Procedure Laterality Date   APPENDECTOMY     KIDNEY STONE SURGERY     TIBIA IM NAIL INSERTION Right 02/06/2020   Procedure: INTRAMEDULLARY (IM) NAIL TIBIAL;  Surgeon: Bjorn Pippin, MD;  Location: [facility] OR;  Service: Orthopedics;  Laterality: Right;    No family history on file.  Social History   Socioeconomic History   Marital status: Unknown    Spouse name: Not on file   Number of children: Not on file   Years of education: Not on file   Highest education level: Not on file  Occupational History   Not on file  Tobacco Use   Smoking status: Every Day    Packs/day: 1.00    Years: 7.00    Pack years: 7.00    Types: Cigarettes   Smokeless tobacco: Never  Vaping Use   Vaping Use: Never used  Substance and Sexual Activity  Alcohol use: Not Currently    Comment: stopped drinking June 15,2022   Drug use: Not Currently    Types: Cocaine, Marijuana, Methamphetamines   Sexual activity: Not Currently  Other Topics Concern   Not on file  Social History Narrative   ** Merged History Encounter **       Social Determinants of Health   Financial Resource Strain: Not on file  Food Insecurity: Not on file  Transportation Needs: Not on file  Physical Activity: Not on file  Stress: Not on file  Social Connections: Not on file  Intimate Partner Violence: Not on file    Outpatient Medications Prior to  Visit  Medication Sig Dispense Refill   acetaminophen (TYLENOL) 325 MG tablet Take by mouth.     carbamazepine (TEGRETOL XR) 100 MG 12 hr tablet Take by mouth. (Patient not taking: Reported on 08/08/2021)     famotidine (PEPCID) 20 MG tablet Take by mouth. (Patient not taking: Reported on 08/08/2021)     hydrOXYzine (VISTARIL) 25 MG capsule Take 1 capsule (25 mg total) by mouth 3 (three) times daily as needed for anxiety. (Patient not taking: Reported on 08/08/2021) 21 capsule 0   sucralfate (CARAFATE) 1 g tablet Take by mouth. (Patient not taking: Reported on 08/08/2021)     traZODone (DESYREL) 50 MG tablet Take 1 tablet (50 mg total) by mouth at bedtime. (Patient not taking: Reported on 08/08/2021) 7 tablet 0   ibuprofen (ADVIL) 800 MG tablet Take by mouth. (Patient not taking: Reported on 08/08/2021)     Naltrexone 380 MG SUSR Inject into the muscle. (Patient not taking: Reported on 08/08/2021)     No facility-administered medications prior to visit.    Allergies  Allergen Reactions   Penicillins Other (See Comments)    Passed out Other reaction(s): Other (See Comments) Childhood allergy states unsure state I think I passed out UNKNOWN, "ALLERGIC WHEN I WAS A CHILD."    Penicillins Other (See Comments)    "passed out"    Review of Systems  Constitutional:  Negative for chills, fever and malaise/fatigue.  HENT:  Positive for ear pain. Negative for ear discharge, hearing loss and tinnitus.   Eyes: Negative.   Respiratory:  Negative for cough and shortness of breath.   Cardiovascular:  Negative for chest pain, palpitations and leg swelling.  Gastrointestinal:  Negative for abdominal pain, blood in stool, constipation, diarrhea, nausea and vomiting.  Genitourinary:  Negative for dysuria, flank pain, frequency, hematuria and urgency.       See HPI  Musculoskeletal:  Positive for joint pain. Negative for back pain, falls, myalgias and neck pain.  Skin: Negative.   Neurological:  Negative.   Psychiatric/Behavioral:  Negative for depression. The patient is not nervous/anxious.   All other systems reviewed and are negative.     Objective:    Physical Exam Vitals reviewed.  Constitutional:      General: He is not in acute distress.    Appearance: Normal appearance. He is normal weight.  HENT:     Head: Normocephalic.     Right Ear: Tympanic membrane, ear canal and external ear normal.     Left Ear: Tympanic membrane, ear canal and external ear normal.     Nose: Nose normal.     Mouth/Throat:     Mouth: Mucous membranes are dry.     Pharynx: Oropharynx is clear.  Eyes:     Extraocular Movements: Extraocular movements intact.     Conjunctiva/sclera: Conjunctivae normal.  Pupils: Pupils are equal, round, and reactive to light.  Neck:     Vascular: Carotid bruit present.  Cardiovascular:     Rate and Rhythm: Normal rate and regular rhythm.     Pulses: Normal pulses.     Heart sounds: Normal heart sounds.     Comments: No obvious peripheral edema Pulmonary:     Effort: Pulmonary effort is normal.     Breath sounds: Normal breath sounds.  Abdominal:     General: Abdomen is flat. Bowel sounds are normal.     Palpations: Abdomen is soft.  Genitourinary:    Testes: Cremasteric reflex is present.        Right: Mass, tenderness or swelling not present.        Left: Swelling present. Mass or tenderness not present.     Comments: Mild swelling noted to left testes Musculoskeletal:        General: No swelling, tenderness, deformity or signs of injury. Normal range of motion.     Right elbow: Normal. No swelling, deformity, effusion or lacerations. Normal range of motion. No tenderness.     Cervical back: Normal range of motion and neck supple. No rigidity or tenderness.     Right lower leg: No edema.     Left lower leg: No edema.  Lymphadenopathy:     Cervical: No cervical adenopathy.  Skin:    General: Skin is warm and dry.     Capillary Refill:  Capillary refill takes less than 2 seconds.  Neurological:     General: No focal deficit present.     Mental Status: He is alert and oriented to person, place, and time.  Psychiatric:        Mood and Affect: Mood normal.        Behavior: Behavior normal.        Thought Content: Thought content normal.        Judgment: Judgment normal.    BP 114/79   Pulse 80   Temp 98.6 F (37 C)   Ht 5\' 9"  (1.753 m)   Wt 179 lb 0.6 oz (81.2 kg)   SpO2 99%   BMI 26.44 kg/m  Wt Readings from Last 3 Encounters:  08/08/21 179 lb 0.6 oz (81.2 kg)  04/14/20 175 lb (79.4 kg)  03/20/20 175 lb (79.4 kg)    Immunization History  Administered Date(s) Administered   PFIZER Comirnaty(Gray Top)Covid-19 Tri-Sucrose Vaccine 01/09/2020   PFIZER(Purple Top)SARS-COV-2 Vaccination 02/03/2020   Tdap 12/13/2019    Diabetic Foot Exam - Simple   No data filed     Lab Results  Component Value Date   TSH 5.560 (H) 08/31/2016   Lab Results  Component Value Date   WBC 5.0 04/15/2020   HGB 14.3 04/15/2020   HCT 41.4 04/15/2020   MCV 98.8 04/15/2020   PLT 120 (L) 04/15/2020   Lab Results  Component Value Date   NA 138 04/15/2020   K 3.7 04/15/2020   CO2 22 04/15/2020   GLUCOSE 92 04/15/2020   BUN <5 (L) 04/15/2020   CREATININE 0.90 04/15/2020   BILITOT 0.9 04/15/2020   ALKPHOS 71 04/15/2020   AST 89 (H) 04/15/2020   ALT 41 04/15/2020   PROT 6.6 04/15/2020   ALBUMIN 3.9 04/15/2020   CALCIUM 9.2 04/15/2020   ANIONGAP 13 04/15/2020   Lab Results  Component Value Date   CHOL 233 (H) 08/30/2016   Lab Results  Component Value Date   HDL 133 08/30/2016   Lab  Results  Component Value Date   LDLCALC 86 08/30/2016   Lab Results  Component Value Date   TRIG 71 08/30/2016   Lab Results  Component Value Date   CHOLHDL 1.8 08/30/2016   Lab Results  Component Value Date   HGBA1C 5.0 08/30/2016       Assessment & Plan:   Problem List Items Addressed This Visit   None Visit  Diagnoses     Healthcare maintenance    -  Primary   Relevant Orders   Flu Vaccine QUAD 37mo+IM (Fluarix, Fluzone & Alfiuria Quad PF)   CBC with Differential/Platelet   Comprehensive metabolic panel   Lipid panel   Hemoglobin A1c Encouraged continued efforts in maintaining sobriety Encouraged continued diet    Scrotal pain       Relevant Orders   US Scrotum Concerned for less likely infectious process v hernia v malignancy           Will complete US and possible referral to urology    Otitis externa of both ears, unspecified chronicity, unspecified type       Relevant Medications   ciprofloxacin-hydrocortisone (CIPRO HC) OTIC suspension   Follow up in 6 mths for reevaluation of symptoms, sooner as needed    I have discontinued Michael Cordova's ibuprofen and Naltrexone. I am also having him start on Cipro HC. Additionally, I am having him maintain his traZODone, hydrOXYzine, acetaminophen, carbamazepine, famotidine, and sucralfate.  Meds ordered this encounter  Medications   ciprofloxacin-hydrocortisone (CIPRO HC) OTIC suspension    Sig: Place 3 drops into both ears 2 (two) times daily for 7 days.    Dispense:  10 mL    Refill:  0     Teena Dunk, NP

## 2021-08-08 NOTE — Patient Instructions (Signed)
You were seen today in the [facility] to establish care. Labs were collected, results will be available via MyChart or, if abnormal, you will be contacted by clinic staff. You were prescribed medications, please take as directed. Please follow up in 6 mths for reevaluation of symptoms.

## 2021-08-09 LAB — COMPREHENSIVE METABOLIC PANEL
ALT: 16 IU/L (ref 0–44)
AST: 18 IU/L (ref 0–40)
Albumin/Globulin Ratio: 2.3 — ABNORMAL HIGH (ref 1.2–2.2)
Albumin: 4.9 g/dL (ref 4.0–5.0)
Alkaline Phosphatase: 71 IU/L (ref 44–121)
BUN/Creatinine Ratio: 13 (ref 9–20)
BUN: 14 mg/dL (ref 6–24)
Bilirubin Total: 0.3 mg/dL (ref 0.0–1.2)
CO2: 23 mmol/L (ref 20–29)
Calcium: 9.3 mg/dL (ref 8.7–10.2)
Chloride: 103 mmol/L (ref 96–106)
Creatinine, Ser: 1.12 mg/dL (ref 0.76–1.27)
Globulin, Total: 2.1 g/dL (ref 1.5–4.5)
Glucose: 94 mg/dL (ref 70–99)
Potassium: 4.4 mmol/L (ref 3.5–5.2)
Sodium: 140 mmol/L (ref 134–144)
Total Protein: 7 g/dL (ref 6.0–8.5)
eGFR: 84 mL/min/{1.73_m2} (ref >59)

## 2021-08-09 LAB — CBC WITH DIFFERENTIAL/PLATELET
Basophils Absolute: 0.1 10*3/uL (ref 0.0–0.2)
Basos: 1 %
EOS (ABSOLUTE): 0.1 10*3/uL (ref 0.0–0.4)
Eos: 1 %
Hematocrit: 42.6 % (ref 37.5–51.0)
Hemoglobin: 14.8 g/dL (ref 13.0–17.7)
Immature Grans (Abs): 0 10*3/uL (ref 0.0–0.1)
Immature Granulocytes: 0 %
Lymphocytes Absolute: 1.7 10*3/uL (ref 0.7–3.1)
Lymphs: 23 %
MCH: 30.8 pg (ref 26.6–33.0)
MCHC: 34.7 g/dL (ref 31.5–35.7)
MCV: 89 fL (ref 79–97)
Monocytes Absolute: 0.6 10*3/uL (ref 0.1–0.9)
Monocytes: 8 %
Neutrophils Absolute: 5 10*3/uL (ref 1.4–7.0)
Neutrophils: 67 %
Platelets: 289 10*3/uL (ref 150–450)
RBC: 4.8 x10E6/uL (ref 4.14–5.80)
RDW: 11.8 % (ref 11.6–15.4)
WBC: 7.5 10*3/uL (ref 3.4–10.8)

## 2021-08-09 LAB — LIPID PANEL
Chol/HDL Ratio: 4.6 ratio (ref 0.0–5.0)
Cholesterol, Total: 211 mg/dL — ABNORMAL HIGH (ref 100–199)
HDL: 46 mg/dL (ref >39)
LDL Chol Calc (NIH): 155 mg/dL — ABNORMAL HIGH (ref 0–99)
Triglycerides: 57 mg/dL (ref 0–149)
VLDL Cholesterol Cal: 10 mg/dL (ref 5–40)

## 2021-08-09 LAB — HEMOGLOBIN A1C
Est. average glucose Bld gHb Est-mCnc: 111 mg/dL
Hgb A1c MFr Bld: 5.5 % (ref 4.8–5.6)

## 2021-08-11 ENCOUNTER — Ambulatory Visit (HOSPITAL_COMMUNITY)
Admission: RE | Admit: 2021-08-11 | Discharge: 2021-08-11 | Disposition: A | Discharge status: Home / Self Care | Payer: 59 | Source: Ambulatory Visit | Attending: Nurse Practitioner | Admitting: Nurse Practitioner

## 2021-08-11 ENCOUNTER — Other Ambulatory Visit: Payer: Self-pay

## 2021-08-11 DIAGNOSIS — N5082 Scrotal pain: Secondary | ICD-10-CM | POA: Diagnosis not present

## 2021-08-14 ENCOUNTER — Other Ambulatory Visit: Payer: Self-pay | Admitting: Nurse Practitioner

## 2021-08-14 DIAGNOSIS — N5082 Scrotal pain: Secondary | ICD-10-CM

## 2021-12-15 IMAGING — CR DG CHEST 2V
2 series · 2 of 2 positions shown · non-contrast
Comparison: 03/07/2019

CLINICAL DATA: Chest pain

EXAM:
CHEST - 2 VIEW

[chest pa]
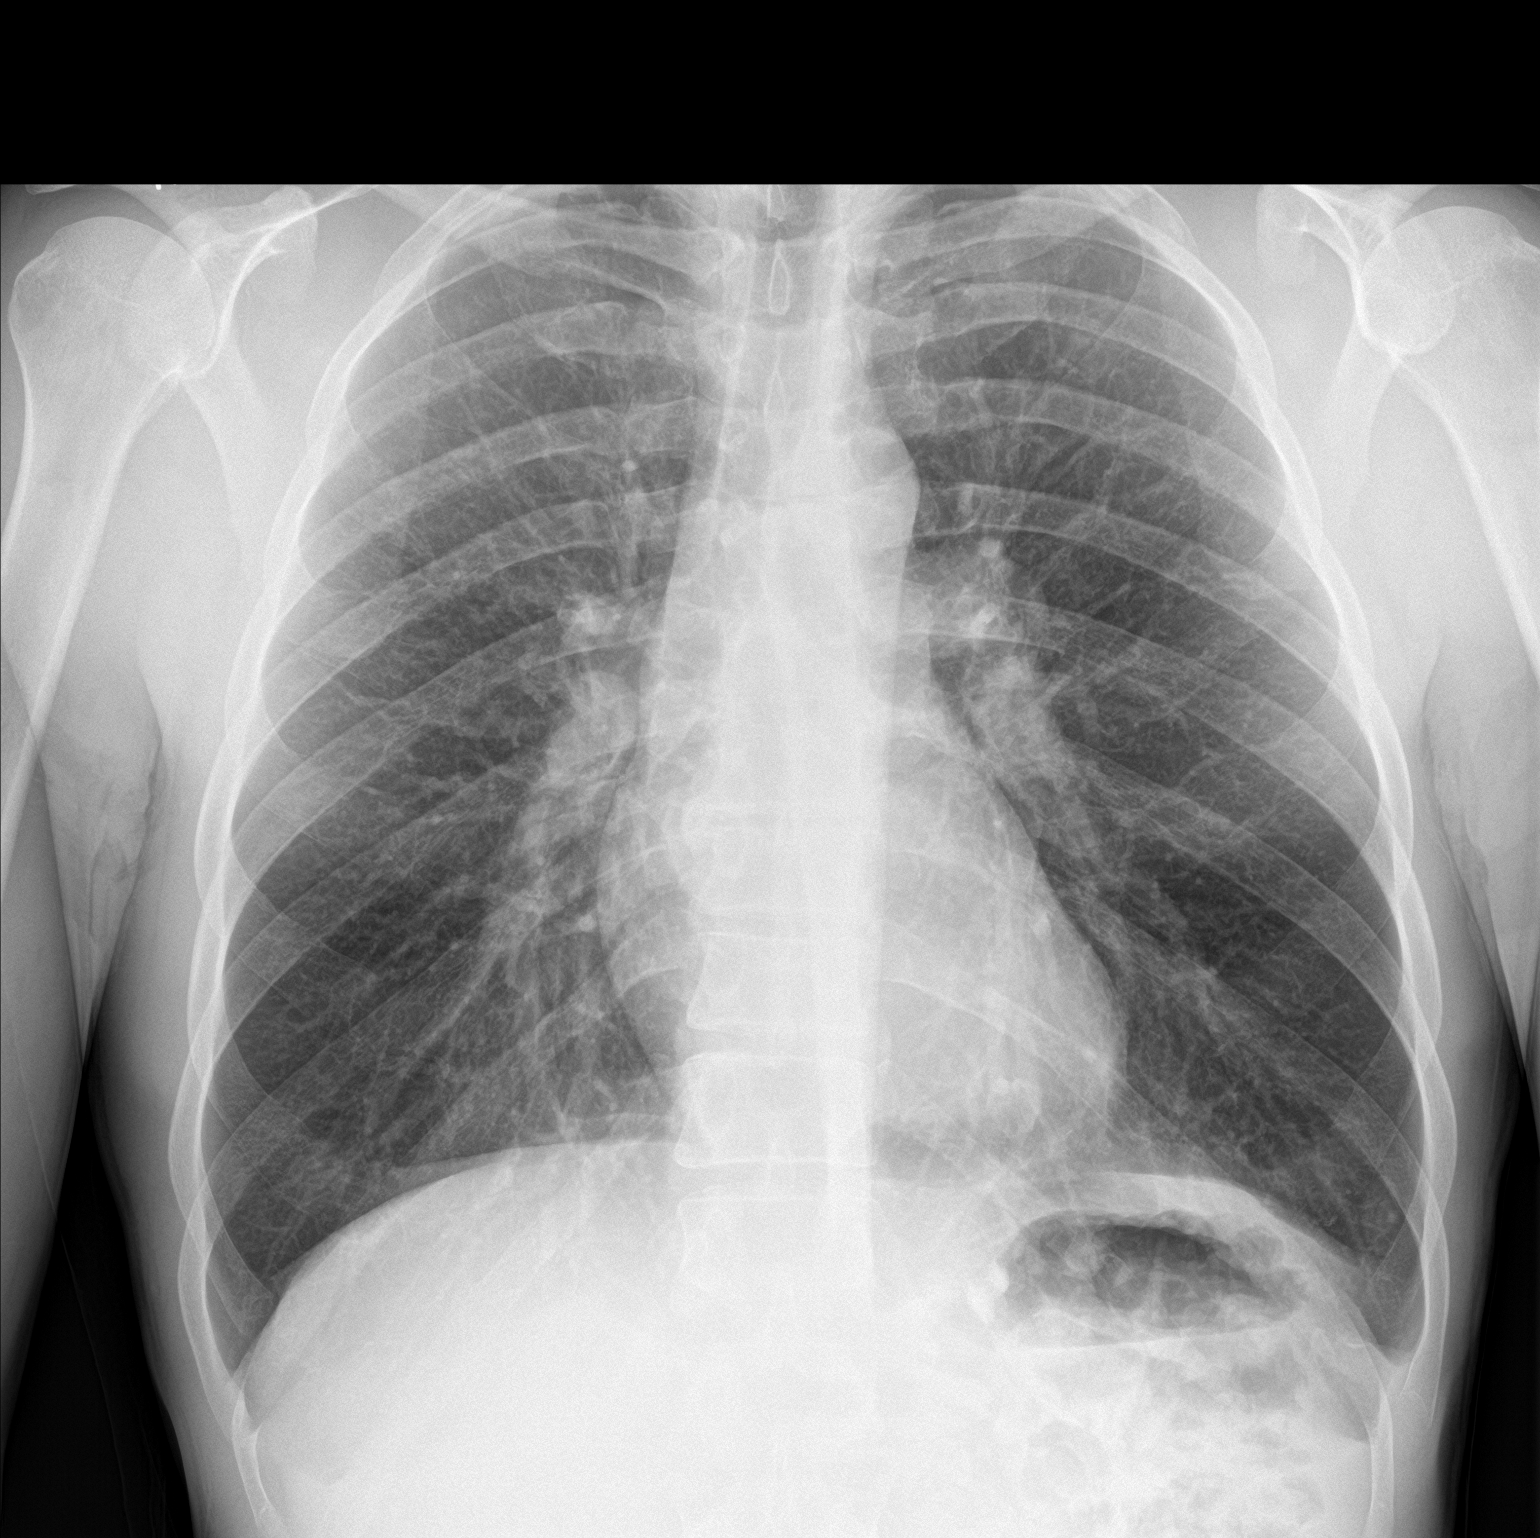

[chest lat]
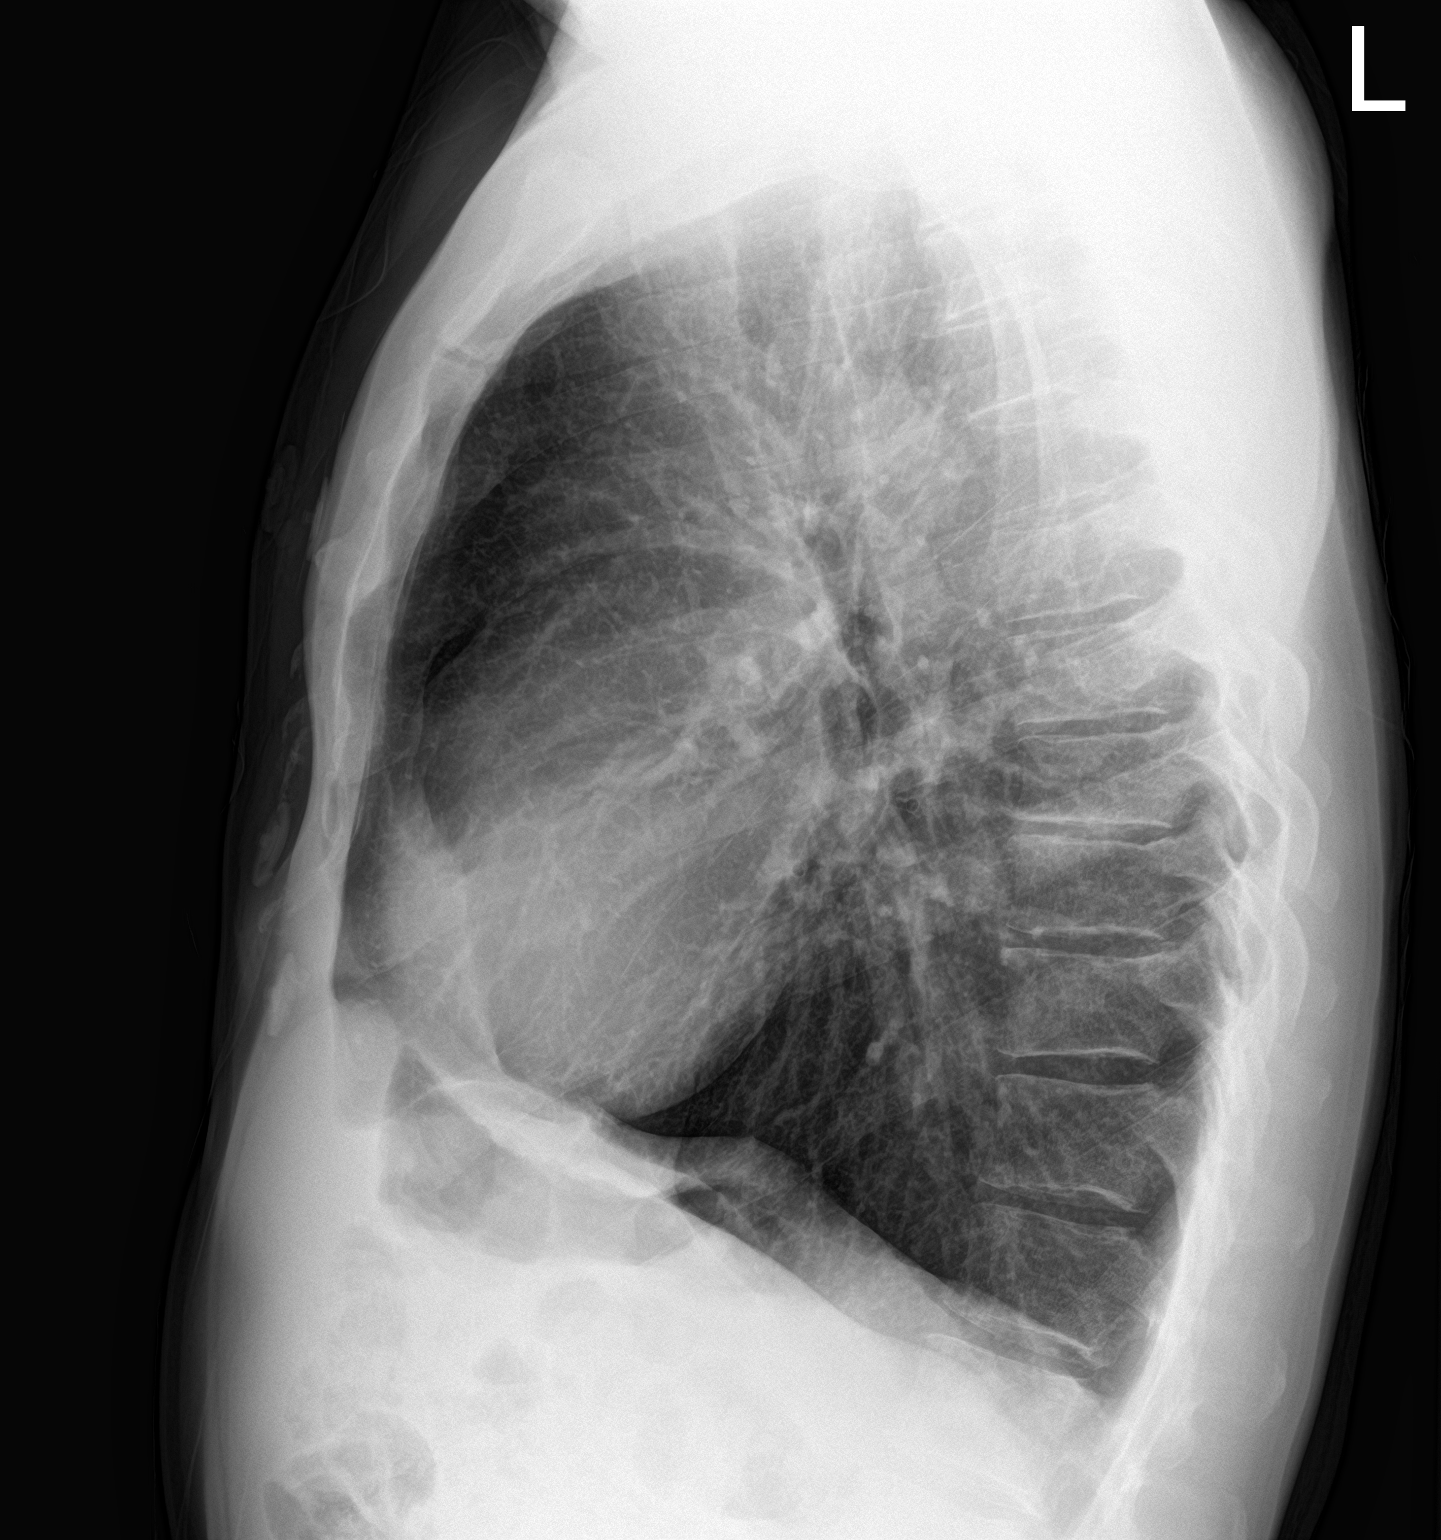

[2 of 2 positions shown; findings below may reference images not displayed]

FINDINGS: Cardiac shadows within normal limits. The lungs are hyperinflated
consistent with COPD. No focal infiltrate or sizable effusion is
seen. No bony abnormality is noted.
IMPRESSION: COPD without acute abnormality.

## 2022-05-12 NOTE — ED Triage Notes (Signed)
 Pt ambulatory to triage with complaint of cutting left knee with chainsaw around 1300 today. Denies any numbness/tingling in distal extremity. Bleeding currently controlled at this time with dressing in place. Patient states he believes his last tetanus was in the last 10 years.

## 2023-10-04 ENCOUNTER — Encounter: Payer: Self-pay | Admitting: Nurse Practitioner

## 2024-03-28 ENCOUNTER — Emergency Department (HOSPITAL_COMMUNITY)
Admission: EM | Admit: 2024-03-28 | Discharge: 2024-03-28 | Disposition: A | Discharge status: Home / Self Care | Payer: Self-pay | Attending: Emergency Medicine | Admitting: Emergency Medicine

## 2024-03-28 ENCOUNTER — Encounter (HOSPITAL_COMMUNITY): Payer: Self-pay

## 2024-03-28 ENCOUNTER — Other Ambulatory Visit: Payer: Self-pay

## 2024-03-28 DIAGNOSIS — T63301A Toxic effect of unspecified spider venom, accidental (unintentional), initial encounter: Secondary | ICD-10-CM

## 2024-03-28 DIAGNOSIS — W57XXXA Bitten or stung by nonvenomous insect and other nonvenomous arthropods, initial encounter: Secondary | ICD-10-CM | POA: Insufficient documentation

## 2024-03-28 DIAGNOSIS — S60361A Insect bite (nonvenomous) of right thumb, initial encounter: Secondary | ICD-10-CM | POA: Insufficient documentation

## 2024-03-28 NOTE — ED Triage Notes (Addendum)
 Pt reports he was bit by a black widow spider just PTA on his right thumb (pt brought spider with him in a bag). Pt c.o burning pain. No swelling or redness noted.

## 2024-03-29 NOTE — ED Provider Notes (Signed)
 [place] EMERGENCY DEPARTMENT AT [facility] Provider Note   CSN: [id number] Arrival date & time: 03/28/24  1012     Patient presents with: Insect Bite   Michael Cordova is a 43 y.o. male.   HPI Patient presents with spider bite to right thumb.  No redness or swelling but some mild burning.  Presents with a spider which is black.  However does not have the typical hourglass on it.  Patient states he was told that you can die from a black widow bite.   Past Medical History:  Diagnosis Date   Alcohol abuse    Bipolar 2 disorder (HCC)    Manic depression (HCC)    PTSD (post-traumatic stress disorder)     Prior to Admission medications   Medication Sig Start Date End Date Taking? Authorizing Provider  acetaminophen  (TYLENOL ) 325 MG tablet Take by mouth.    [provider]  carbamazepine (TEGRETOL XR) 100 MG 12 hr tablet Take by mouth. Patient not taking: Reported on 08/08/2021 08/12/18   [provider]  famotidine (PEPCID) 20 MG tablet Take by mouth. Patient not taking: Reported on 08/08/2021 08/04/18   [provider]  hydrOXYzine  (VISTARIL ) 25 MG capsule Take 1 capsule (25 mg total) by mouth 3 (three) times daily as needed for anxiety. Patient not taking: Reported on 08/08/2021 02/28/21   Mardy Elveria DEL, NP  sucralfate (CARAFATE) 1 g tablet Take by mouth. Patient not taking: Reported on 08/08/2021 03/07/19   [provider]  traZODone  (DESYREL ) 50 MG tablet Take 1 tablet (50 mg total) by mouth at bedtime. Patient not taking: Reported on 08/08/2021 02/28/21   Mardy Elveria DEL, NP  albuterol  (PROVENTIL  HFA;VENTOLIN  HFA) 108 (90 Base) MCG/ACT inhaler Inhale 1-2 puffs into the lungs every 4 (four) hours as needed for wheezing or shortness of breath. Patient not taking: Reported on 12/23/2019 09/04/16 02/28/21  Rowland Norleen BROCKS, FNP  citalopram  (CELEXA ) 20 MG tablet Take 1 tablet (20 mg total) by mouth daily. 09/05/16 02/28/21  Withrow,  Norleen BROCKS, FNP  gabapentin  (NEURONTIN ) 300 MG capsule Take 1 capsule (300 mg total) by mouth 3 (three) times daily. 09/04/16 02/28/21  Withrow, Norleen BROCKS, FNP    Allergies: Penicillins and Penicillins    Review of Systems  Updated Vital Signs BP 121/80   Pulse 80   Temp 97.9 F (36.6 C)   Resp 14   SpO2 95%   Physical Exam Vitals and nursing note reviewed.  Skin:    Comments: Small area of erythema on pad of right thumb.  No real swelling.  Neurological:     Mental Status: He is alert.     (all labs ordered are listed, but only abnormal results are displayed) Labs Reviewed - No data to display  EKG: None  Radiology: No results found.   Procedures   Medications Ordered in the ED - No data to display                                  Medical Decision Making  Patient with spider bite to right thumb.  Presents with spider and it is black but not definitely a black widow spider.  However appears to be a small envenomation if it was.  Do not think it needs any specific treatment besides supportive care at this time.  Follow-up instructions given.  Will discharge home.     Final diagnoses:  Spider bite wound, accidental or unintentional, initial encounter    ED Discharge Orders     None          Patsey Lot, MD 03/29/24 0730

## 2024-06-27 MED ORDER — TAMSULOSIN HCL 0.4 MG PO CAPS: 0.8000 mg | ORAL_CAPSULE | Freq: Every day | ORAL | 0 refills | Status: DC

## 2024-06-29 ENCOUNTER — Telehealth: Payer: Self-pay | Admitting: Nurse Practitioner

## 2024-06-29 DIAGNOSIS — N200 Calculus of kidney: Secondary | ICD-10-CM

## 2024-06-29 DIAGNOSIS — R350 Frequency of micturition: Secondary | ICD-10-CM

## 2024-06-29 DIAGNOSIS — N401 Enlarged prostate with lower urinary tract symptoms: Secondary | ICD-10-CM

## 2024-06-29 NOTE — Progress Notes (Signed)
 Acute Video Visit    Virtual Visit Consent:   Michael Cordova, you are scheduled for a virtual visit with a [facility] provider today.     Just as with appointments in the office, your consent must be obtained to participate.  Your consent will be active for this visit and any virtual visit you may have with one of our providers in the next 365 days.     If you have a MyChart account, a copy of this consent can be sent to you electronically.  All virtual visits are billed to your insurance company just like a traditional visit in the office.    If the connection with a video visit is poor, the visit may have to be switched to a telephone visit.  With either a video or telephone visit, we are not always able to ensure that we have a secure connection.     I need to obtain your verbal consent now.   Are you willing to proceed with your visit today?    Michael Cordova has provided verbal consent on 06/29/2024 for a virtual visit (video or telephone).   Michael Kitty, FNP  Date: 06/29/2024 11:21 AM  Subjective:     Patient ID: Michael Cordova, male    DOB: October 15, 1980, 43 y.o.   MRN: [id number]  Michael Cordova Michael Cordova, connected with  Michael Cordova  ([id number], October 16, 1980) on 06/29/24 at  8:15 AM EDT by a video-enabled telemedicine application and verified that I am speaking with the correct person using two identifiers.   Location: Patient: [facility]  Provider: Virtual Visit Location Provider: Home Office   I discussed the limitations of evaluation and management by telemedicine and the availability of in person appointments. The patient expressed understanding and agreed to proceed.      HPI Michael Cordova is a 43 y.o. who identifies as a male who was assigned male at birth, and is being seen today for assistance with medication refills.    The patient gets his care at the [facility] with [facility]. He is awaiting a new PCP appointment next month and is requesting a one month refill on medications  until that time   His medical history is significant for chronic recurrent kidney stones for which he has used tamsulosin  for relief in the past   Today he is experiencing pain with urination without fever N/V and some lower back pain that he associated with passing a kidney stone in the past  He is without transportation and prefers care via [facility] and not UC/ED          Objective:     Physical Exam Constitutional:      Appearance: Normal appearance.  HENT:     Mouth/Throat:     Mouth: Mucous membranes are moist.  Pulmonary:     Effort: Pulmonary effort is normal.  Skin:    General: Skin is warm.  Neurological:     Mental Status: He is alert.  Psychiatric:        Mood and Affect: Mood normal.           Assessment & Plan:    Medication refilled today, encouraged to use [facility] while awaiting new PCP as needed Seek immediate follow up with worsening symptoms, inability to void, fever or  Problem List Items Addressed This Visit       Genitourinary   Kidney stones - Primary   Relevant Medications   tamsulosin  (FLOMAX ) 0.4 MG  CAPS capsule   Other Visit Diagnoses       Benign prostatic hyperplasia with urinary frequency           Meds ordered this encounter  Medications   tamsulosin  (FLOMAX ) 0.4 MG CAPS capsule    Sig: Take 2 capsules (0.8 mg total) by mouth daily.    Dispense:  60 capsule    Refill:  0    No follow-ups on file.  Follow Up Instructions: I discussed the assessment and treatment plan with the patient. The patient was provided an opportunity to ask questions and all were answered. The patient agreed with the plan and demonstrated an understanding of the instructions.  A copy of instructions were sent to the patient via MyChart unless otherwise noted below.     The patient was advised to call back or seek an in-person evaluation if the symptoms worsen or if the condition fails to improve as anticipated.    Michael Kitty, FNP  **Disclaimer:  This note may have been dictated with voice recognition software. Similar sounding words can inadvertently be transcribed and this note may contain transcription errors which may not have been corrected upon publication of note.**

## 2024-07-02 ENCOUNTER — Ambulatory Visit: Payer: Self-pay | Admitting: Nurse Practitioner

## 2024-07-03 ENCOUNTER — Telehealth: Payer: Self-pay | Admitting: Nurse Practitioner

## 2024-07-03 DIAGNOSIS — N200 Calculus of kidney: Secondary | ICD-10-CM

## 2024-07-03 MED ORDER — TAMSULOSIN HCL 0.4 MG PO CAPS: 0.8000 mg | ORAL_CAPSULE | Freq: Every day | ORAL | 0 refills | Status: DC

## 2024-07-03 NOTE — Progress Notes (Signed)
 Requesting pharmacy change for medications   No charge for visit   1. Kidney stones  - tamsulosin  (FLOMAX ) 0.4 MG CAPS capsule; Take 2 capsules (0.8 mg total) by mouth daily.  Dispense: 60 capsule; Refill: 0

## 2024-07-30 ENCOUNTER — Other Ambulatory Visit: Payer: Self-pay | Admitting: Nurse Practitioner

## 2024-07-30 DIAGNOSIS — N200 Calculus of kidney: Secondary | ICD-10-CM

## 2024-08-15 ENCOUNTER — Other Ambulatory Visit: Payer: Self-pay | Admitting: Nurse Practitioner

## 2024-08-15 DIAGNOSIS — N200 Calculus of kidney: Secondary | ICD-10-CM

## 2024-08-20 ENCOUNTER — Ambulatory Visit: Payer: Self-pay | Admitting: Nurse Practitioner

## 2024-08-20 DIAGNOSIS — M545 Low back pain, unspecified: Secondary | ICD-10-CM

## 2024-08-20 MED ORDER — PREDNISONE 10 MG (21) PO TBPK: ORAL_TABLET | ORAL | 0 refills | Status: AC

## 2024-08-20 MED ORDER — CYCLOBENZAPRINE HCL 10 MG PO TABS: 10.0000 mg | ORAL_TABLET | Freq: Three times a day (TID) | ORAL | 0 refills | Status: AC | PRN

## 2024-08-20 NOTE — Progress Notes (Signed)
 Virtual Visit Consent   Michael Cordova, you are scheduled for a virtual visit with a [facility] provider today. Just as with appointments in the office, your consent must be obtained to participate. Your consent will be active for this visit and any virtual visit you may have with one of our providers in the next 365 days. If you have a MyChart account, a copy of this consent can be sent to you electronically.  As this is a virtual visit, video technology does not allow for your provider to perform a traditional examination. This may limit your provider's ability to fully assess your condition. If your provider identifies any concerns that need to be evaluated in person or the need to arrange testing (such as labs, EKG, etc.), we will make arrangements to do so. Although advances in technology are sophisticated, we cannot ensure that it will always work on either your end or our end. If the connection with a video visit is poor, the visit may have to be switched to a telephone visit. With either a video or telephone visit, we are not always able to ensure that we have a secure connection.  By engaging in this virtual visit, you consent to the provision of healthcare and authorize for your insurance to be billed (if applicable) for the services provided during this visit. Depending on your insurance coverage, you may receive a charge related to this service.  I need to obtain your verbal consent now. Are you willing to proceed with your visit today? Michael Cordova has provided verbal consent on 08/20/2024 for a virtual visit (telephone). Lauraine Kitty, FNP  Date: 08/20/2024 1:17 PM   Virtual Visit via Video Note   I, Lauraine Kitty, connected with  Michael Cordova  ([id number], 06-09-1981) on 08/20/2024 at  1:20 PM EST by a telephone call  and verified that I am speaking with the correct person using two identifiers.  Location:  Patient: Virtual Visit Location Patient: Home Provider: Virtual Visit  Location Provider: Home Office    History of Present Illness:  Michael Cordova is a 43 y.o. who identifies as a male who was assigned male at birth, and is being seen today for back pain.   He hurt his back while bending over to tie his shoe this week. Has pain radiating down his back. Denies weakness or numbness. He has had recurrent back pain in the past and has been using [employer] back pain relief OTC and some Flexeril that he had at home.   Continues to have pain today and difficulty sleeping due to pain.    HPI:  Problems:  Patient Active Problem List   Diagnosis Date Noted   Displaced comminuted fracture of shaft of right tibia, initial encounter for closed fracture 02/05/2020   Kidney stones 06/12/2019   Bipolar 1 disorder, depressed, moderate (HCC) 08/11/2018   Mood disorder in conditions classified elsewhere    Alcohol use disorder, severe, dependence (HCC) 08/29/2016    Allergies: Allergies[1] Medications: Current Medications[2]  Observations/Objective: Patient is in no acute distress.  No labored breathing.  Speech is clear and coherent with logical content.  Patient is alert and oriented at baseline.    Assessment and Plan:  Encouraged to follow with PCP in the next two week, earlier with new concerns. May contact [facility] for urgent needs    1. Acute midline low back pain without sciatica (Primary) - cyclobenzaprine (FLEXERIL) 10 MG tablet; Take 1 tablet (10 mg total) by mouth  3 (three) times daily as needed for muscle spasms.  Dispense: 30 tablet; Refill: 0 - predniSONE (STERAPRED UNI-PAK 21 TAB) 10 MG (21) TBPK tablet; Take 6 tablets on day one, 5 on day two, 4 on day three, 3 on day four, 2 on day five, and 1 on day six. Take with food.  Dispense: 21 tablet; Refill: 0    Follow Up Instructions: I discussed the assessment and treatment plan with the patient. The patient was provided an opportunity to ask questions and all were answered. The patient agreed with the  plan and demonstrated an understanding of the instructions.  A copy of instructions were sent to the patient via MyChart unless otherwise noted below.    The patient was advised to call back or seek an in-person evaluation if the symptoms worsen or if the condition fails to improve as anticipated.    Lauraine Kitty, FNP     [1]  Allergies Allergen Reactions   Penicillins Other (See Comments)    Passed out Other reaction(s): Other (See Comments) Childhood allergy states unsure state I think I passed out UNKNOWN, ALLERGIC WHEN I WAS A CHILD.    Penicillins Other (See Comments)    passed out  [2]  Current Outpatient Medications:    acetaminophen  (TYLENOL ) 325 MG tablet, Take by mouth., Disp: , Rfl:    carbamazepine (TEGRETOL XR) 100 MG 12 hr tablet, Take by mouth. (Patient not taking: Reported on 08/08/2021), Disp: , Rfl:    famotidine (PEPCID) 20 MG tablet, Take by mouth. (Patient not taking: Reported on 08/08/2021), Disp: , Rfl:    hydrOXYzine  (VISTARIL ) 25 MG capsule, Take 1 capsule (25 mg total) by mouth 3 (three) times daily as needed for anxiety. (Patient not taking: Reported on 08/08/2021), Disp: 21 capsule, Rfl: 0   sucralfate (CARAFATE) 1 g tablet, Take by mouth. (Patient not taking: Reported on 08/08/2021), Disp: , Rfl:    tamsulosin  (FLOMAX ) 0.4 MG CAPS capsule, TAKE 2 CAPSULES(0.8 MG) BY MOUTH DAILY, Disp: 30 capsule, Rfl: 0   traZODone  (DESYREL ) 50 MG tablet, Take 1 tablet (50 mg total) by mouth at bedtime. (Patient not taking: Reported on 08/08/2021), Disp: 7 tablet, Rfl: 0

## 2024-08-21 ENCOUNTER — Other Ambulatory Visit: Payer: Self-pay | Admitting: Nurse Practitioner

## 2024-08-21 DIAGNOSIS — N200 Calculus of kidney: Secondary | ICD-10-CM

## 2024-09-17 NOTE — Progress Notes (Signed)
 The patient presented for a primary care visit on 06/29/2024. Bp and labs were not taken due to this being a video visit. During the appointment, the SDOH screener was not conducted. Visit notes indicates that he can use [facility] while awaiting a new PCP.   A review of the patient's chart revealed that Ronal Jenkins Houseman, NP is their PCP. There are currently no [facility] visible appts with this PCP ever. Pt was recently seen by Lauraine Kitty, NP - [facility] on 08/20/2024. He does not have any upcoming appts indicated at this time. He has a history of smoking and no SDOH needs according to chart review. CHW did a RTE for insurance because there was not anything indicated during chart review. Chart did not update with an active insurance.   Call Attempt #1: CHW called pt to obtain insurance status. The pt shared that his wife has [employer] and he is under her plan for his insurance.   At this time, no additional support from the [facility] team is necessary.

## 2024-10-13 ENCOUNTER — Other Ambulatory Visit: Payer: Self-pay | Admitting: *Deleted

## 2024-10-13 DIAGNOSIS — N200 Calculus of kidney: Secondary | ICD-10-CM

## 2024-10-13 MED ORDER — TAMSULOSIN HCL 0.4 MG PO CAPS: 0.4000 mg | ORAL_CAPSULE | Freq: Every day | ORAL | 0 refills | Status: AC

## 2024-10-13 NOTE — Progress Notes (Unsigned)
 Seen for monthly Rf of Flomax . Ok x 30 day to pharmacy of choice
